# Patient Record
Sex: Female | Born: 1975 | Race: White | Hispanic: No | Smoking: Never smoker
Health system: Southern US, Community
[De-identification: ages and names within clinical notes are randomized; demographics above are authoritative.]

## PROBLEM LIST (undated history)

## (undated) DIAGNOSIS — Z9221 Personal history of antineoplastic chemotherapy: Secondary | ICD-10-CM

## (undated) DIAGNOSIS — F32A Depression, unspecified: Secondary | ICD-10-CM

## (undated) DIAGNOSIS — Z1501 Genetic susceptibility to malignant neoplasm of breast: Secondary | ICD-10-CM

## (undated) DIAGNOSIS — Z853 Personal history of malignant neoplasm of breast: Secondary | ICD-10-CM

## (undated) DIAGNOSIS — K8020 Calculus of gallbladder without cholecystitis without obstruction: Secondary | ICD-10-CM

## (undated) DIAGNOSIS — R911 Solitary pulmonary nodule: Secondary | ICD-10-CM

## (undated) DIAGNOSIS — K219 Gastro-esophageal reflux disease without esophagitis: Secondary | ICD-10-CM

## (undated) DIAGNOSIS — R1900 Intra-abdominal and pelvic swelling, mass and lump, unspecified site: Secondary | ICD-10-CM

## (undated) DIAGNOSIS — G4700 Insomnia, unspecified: Secondary | ICD-10-CM

## (undated) DIAGNOSIS — K811 Chronic cholecystitis: Secondary | ICD-10-CM

## (undated) DIAGNOSIS — Z1509 Genetic susceptibility to other malignant neoplasm: Secondary | ICD-10-CM

## (undated) DIAGNOSIS — F419 Anxiety disorder, unspecified: Secondary | ICD-10-CM

---

## 2008-10-21 DIAGNOSIS — C50919 Malignant neoplasm of unspecified site of unspecified female breast: Secondary | ICD-10-CM

## 2008-10-21 HISTORY — DX: Malignant neoplasm of unspecified site of unspecified female breast: C50.919

## 2008-12-06 HISTORY — PX: TOTAL ABDOMINAL HYSTERECTOMY: SHX209

## 2009-01-28 HISTORY — PX: MASTECTOMY: SHX3

## 2019-06-18 ENCOUNTER — Other Ambulatory Visit: Payer: Self-pay | Admitting: Internal Medicine

## 2019-06-18 DIAGNOSIS — Z13820 Encounter for screening for osteoporosis: Secondary | ICD-10-CM

## 2019-06-18 DIAGNOSIS — E8940 Asymptomatic postprocedural ovarian failure: Secondary | ICD-10-CM

## 2019-06-30 ENCOUNTER — Ambulatory Visit
Admission: RE | Admit: 2019-06-30 | Discharge: 2019-06-30 | Disposition: A | Discharge status: Home / Self Care | Payer: BC Managed Care – PPO | Source: Ambulatory Visit | Attending: Internal Medicine | Admitting: Internal Medicine

## 2019-06-30 DIAGNOSIS — E8940 Asymptomatic postprocedural ovarian failure: Secondary | ICD-10-CM | POA: Diagnosis present

## 2019-06-30 DIAGNOSIS — Z13820 Encounter for screening for osteoporosis: Secondary | ICD-10-CM | POA: Insufficient documentation

## 2019-06-30 HISTORY — DX: Personal history of antineoplastic chemotherapy: Z92.21

## 2019-07-26 ENCOUNTER — Ambulatory Visit (INDEPENDENT_AMBULATORY_CARE_PROVIDER_SITE_OTHER): Payer: BC Managed Care – PPO | Admitting: Dermatology

## 2019-07-26 ENCOUNTER — Other Ambulatory Visit: Payer: Self-pay

## 2019-07-26 DIAGNOSIS — D485 Neoplasm of uncertain behavior of skin: Secondary | ICD-10-CM

## 2019-07-26 NOTE — Patient Instructions (Addendum)

## 2019-07-26 NOTE — Progress Notes (Signed)
   New Patient Visit  Subjective  Sheila Haney is a 44 y.o. female who presents for the following: Skin Problem.  Patient here today for growth at right eye. Present for about 1 month and has started growing over the last 2 weeks. It does itch occasionally and is tender to touch if patient hits it.  Patient is a breast cancer survivor but no family or personal history of skin cancer.  The following portions of the chart were reviewed this encounter and updated as appropriate:  Allergies  Meds  Problems  Med Hx  Surg Hx  Fam Hx      Review of Systems:  No other skin or systemic complaints except as noted in HPI or Assessment and Plan.  Objective  Well appearing patient in no apparent distress; mood and affect are within normal limits.  A focused examination was performed including face. Relevant physical exam findings are noted in the Assessment and Plan.  Objective  Right Lower Eyelid Margin: 0.5cm yellow papule with history of rapid growth   Assessment & Plan    Neoplasm of uncertain behavior of skin Right Lower Eyelid Margin  Skin / nail biopsy Type of biopsy: tangential   Informed consent: discussed and consent obtained   Timeout: patient name, date of birth, surgical site, and procedure verified   Procedure prep:  Patient was prepped and draped in usual sterile fashion Prep type:  Isopropyl alcohol Anesthesia: the lesion was anesthetized in a standard fashion   Anesthetic:  1% lidocaine w/ epinephrine 1-100,000 buffered w/ 8.4% NaHCO3 Instrument used: flexible razor blade   Hemostasis achieved with: pressure, aluminum chloride and electrodesiccation   Outcome: patient tolerated procedure well   Post-procedure details: sterile dressing applied and wound care instructions given   Dressing type: petrolatum and bandage    Specimen 1 - Surgical pathology Differential Diagnosis: Cyst vs Irritated SK vs Wart vs Other r/o CA Check Margins: No 0.5cm yellow papule  with history of rapid growth  Return if symptoms worsen or fail to improve.  Graciella Belton, RMA, am acting as scribe for Sarina Ser, MD . Documentation: I have reviewed the above documentation for accuracy and completeness, and I agree with the above.  Sarina Ser, MD

## 2019-08-09 ENCOUNTER — Encounter: Payer: Self-pay | Admitting: Dermatology

## 2019-08-09 ENCOUNTER — Telehealth: Payer: Self-pay

## 2019-08-09 NOTE — Telephone Encounter (Signed)
Pt had a few more questions about her pathology results.  She was wondering if warts were contagious.  I adivsed warts are contagious.  Advised to wash hands after woundcare.

## 2019-08-09 NOTE — Telephone Encounter (Signed)
Patient advised bx benign viral wart, scheduled for follow up 09/21/19, JS

## 2019-08-09 NOTE — Telephone Encounter (Signed)
Left message for patient to call office for pathology results.

## 2019-08-09 NOTE — Telephone Encounter (Signed)
-----   Message from Ralene Bathe, MD sent at 08/08/2019  7:08 PM EDT ----- Skin , right lower eyelid margin VERRUCA VULGARIS, IRRITATED, INFLAMED, BASE INVOLVED  Benign viral wart May persist or recur If not scheduled, recommend rechecking in about 6 weeks to see if needs further treatment. Please schedule patient an appt.

## 2019-09-21 ENCOUNTER — Ambulatory Visit: Payer: BC Managed Care – PPO | Admitting: Dermatology

## 2019-10-07 ENCOUNTER — Ambulatory Visit: Payer: BC Managed Care – PPO | Admitting: Dermatology

## 2019-10-22 ENCOUNTER — Inpatient Hospital Stay: Payer: BC Managed Care – PPO | Attending: Oncology | Admitting: Oncology

## 2019-10-22 ENCOUNTER — Encounter: Payer: Self-pay | Admitting: Oncology

## 2019-10-22 ENCOUNTER — Other Ambulatory Visit: Payer: Self-pay

## 2019-10-22 ENCOUNTER — Telehealth: Payer: Self-pay | Admitting: *Deleted

## 2019-10-22 ENCOUNTER — Ambulatory Visit
Admission: RE | Admit: 2019-10-22 | Discharge: 2019-10-22 | Disposition: A | Discharge status: Home / Self Care | Payer: BC Managed Care – PPO | Attending: Oncology | Admitting: Oncology

## 2019-10-22 ENCOUNTER — Ambulatory Visit
Admission: RE | Admit: 2019-10-22 | Discharge: 2019-10-22 | Disposition: A | Discharge status: Home / Self Care | Payer: BC Managed Care – PPO | Source: Ambulatory Visit | Attending: Oncology | Admitting: Oncology

## 2019-10-22 ENCOUNTER — Inpatient Hospital Stay: Payer: BC Managed Care – PPO

## 2019-10-22 ENCOUNTER — Telehealth: Payer: Self-pay

## 2019-10-22 VITALS — BP 122/90 | HR 99 | Temp 97.7°F | Resp 18 | Ht 71.65 in | Wt 194.6 lb

## 2019-10-22 DIAGNOSIS — Z853 Personal history of malignant neoplasm of breast: Secondary | ICD-10-CM

## 2019-10-22 DIAGNOSIS — R0789 Other chest pain: Secondary | ICD-10-CM | POA: Insufficient documentation

## 2019-10-22 DIAGNOSIS — Z9013 Acquired absence of bilateral breasts and nipples: Secondary | ICD-10-CM | POA: Diagnosis not present

## 2019-10-22 DIAGNOSIS — Z79899 Other long term (current) drug therapy: Secondary | ICD-10-CM | POA: Insufficient documentation

## 2019-10-22 DIAGNOSIS — Z1501 Genetic susceptibility to malignant neoplasm of breast: Secondary | ICD-10-CM | POA: Diagnosis not present

## 2019-10-22 DIAGNOSIS — Z9221 Personal history of antineoplastic chemotherapy: Secondary | ICD-10-CM | POA: Diagnosis not present

## 2019-10-22 DIAGNOSIS — Z1509 Genetic susceptibility to other malignant neoplasm: Secondary | ICD-10-CM

## 2019-10-22 LAB — CBC WITH DIFFERENTIAL/PLATELET
Abs Immature Granulocytes: 0.02 10*3/uL (ref 0.00–0.07)
Basophils Absolute: 0.1 10*3/uL (ref 0.0–0.1)
Basophils Relative: 1 %
Eosinophils Absolute: 0.2 10*3/uL (ref 0.0–0.5)
Eosinophils Relative: 2 %
HCT: 44.1 % (ref 36.0–46.0)
Hemoglobin: 15.5 g/dL — ABNORMAL HIGH (ref 12.0–15.0)
Immature Granulocytes: 0 %
Lymphocytes Relative: 19 %
Lymphs Abs: 1.5 10*3/uL (ref 0.7–4.0)
MCH: 30.2 pg (ref 26.0–34.0)
MCHC: 35.1 g/dL (ref 30.0–36.0)
MCV: 86 fL (ref 80.0–100.0)
Monocytes Absolute: 0.4 10*3/uL (ref 0.1–1.0)
Monocytes Relative: 5 %
Neutro Abs: 5.9 10*3/uL (ref 1.7–7.7)
Neutrophils Relative %: 73 %
Platelets: 221 10*3/uL (ref 150–400)
RBC: 5.13 MIL/uL — ABNORMAL HIGH (ref 3.87–5.11)
RDW: 12.3 % (ref 11.5–15.5)
WBC: 8 10*3/uL (ref 4.0–10.5)
nRBC: 0 % (ref 0.0–0.2)

## 2019-10-22 LAB — COMPREHENSIVE METABOLIC PANEL
ALT: 23 U/L (ref 0–44)
AST: 21 U/L (ref 15–41)
Albumin: 4.6 g/dL (ref 3.5–5.0)
Alkaline Phosphatase: 49 U/L (ref 38–126)
Anion gap: 9 (ref 5–15)
BUN: 17 mg/dL (ref 6–20)
CO2: 30 mmol/L (ref 22–32)
Calcium: 9.6 mg/dL (ref 8.9–10.3)
Chloride: 101 mmol/L (ref 98–111)
Creatinine, Ser: 1.11 mg/dL — ABNORMAL HIGH (ref 0.44–1.00)
GFR calc Af Amer: >60 mL/min (ref >60)
GFR calc non Af Amer: >60 mL/min (ref >60)
Glucose, Bld: 105 mg/dL — ABNORMAL HIGH (ref 70–99)
Potassium: 4.7 mmol/L (ref 3.5–5.1)
Sodium: 140 mmol/L (ref 135–145)
Total Bilirubin: 0.9 mg/dL (ref 0.3–1.2)
Total Protein: 7.7 g/dL (ref 6.5–8.1)

## 2019-10-22 NOTE — Progress Notes (Addendum)
Hematology/Oncology Consult note [facility] [phone] [phone]   Patient Care Team: Patient, No Pcp Per as PCP - General (General Practice)  REFERRING PROVIDER: No ref. provider found  CHIEF COMPLAINTS/REASON FOR VISIT:  Evaluation of history of breast cancer and breast pain  HISTORY OF PRESENTING ILLNESS:   Sheila Haney is a  44 y.o.  female with PMH listed below was seen in consultation at the request of  No ref. provider found  for evaluation of history of breast cancer and breat pain.  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  Also reports to have right breast precancer lesions.  She was 33 at that time.  Genetic testing was done and was positive for BRCA1 mutation. Patient reports that she had bilateral mastectomy with sentinel lymph node biopsy.  She also reports history of total hysterectomy and oophorectomy Post surgery,  She received ddAC followed by Taxol, and then 5 years of tamoxifen. Her oncology care was by Dr. Fabienne Bruns in [place].  Sur[facility] done [facility].  She then moved to [place] and she obtained medical advice after experienced right chest wall discomforts.  She had a PET scan done which did not show any bone problem but per patient she had abnormal lung findings for which she got biopsy.  Biopsy was negative for malignancy.  She moved to [place] recently and was referred to establish care with oncology and also patient reports 4 to 5 weeks of left rib cage dull pain which did not go away after taking Tylenol.  No other bone pain.  Worsened with deep breath.   Review of Systems  Constitutional: Negative for appetite change, chills, fatigue and fever.  HENT:   Negative for hearing loss and voice  change.   Eyes: Negative for eye problems.  Respiratory: Negative for chest tightness and cough.   Cardiovascular: Negative for chest pain.  Gastrointestinal: Negative for abdominal distention, abdominal pain and blood in stool.  Endocrine: Negative for hot flashes.  Genitourinary: Negative for difficulty urinating and frequency.   Musculoskeletal: Negative for arthralgias.  Skin: Negative for itching and rash.  Neurological: Negative for extremity weakness.  Hematological: Negative for adenopathy.  Psychiatric/Behavioral: Negative for confusion.  See HPI  MEDICAL HISTORY:  Past Medical History:  Diagnosis Date  . Personal history of chemotherapy     SURGICAL HISTORY: Past Surgical History:  Procedure Laterality Date  . MASTECTOMY Bilateral 2011    SOCIAL HISTORY: Social History   Socioeconomic History  . Marital status: Soil scientist    Spouse name: Not on file  . Number of children: Not on file  . Years of education: Not on file  . Highest education level: Not on file  Occupational History  . Not on file  Tobacco Use  . Smoking status: Not on file  Substance and Sexual Activity  . Alcohol use: Not on file  . Drug use: Not on file  . Sexual activity: Not on file  Other Topics Concern  . Not on file  Social History Narrative  . Not on file   Social Determinants of Health   Financial Resource Strain:   . Difficulty of Paying Living Expenses: Not on file  Food Insecurity:   . Worried About Charity fundraiser in the  Last Year: Not on file  . Ran Out of Food in the Last Year: Not on file  Transportation Needs:   . Lack of Transportation (Medical): Not on file  . Lack of Transportation (Non-Medical): Not on file  Physical Activity:   . Days of Exercise per Week: Not on file  . Minutes of Exercise per Session: Not on file  Stress:   . Feeling of Stress : Not on file  Social Connections:   . Frequency of Communication with Friends and Family: Not on file    . Frequency of Social Gatherings with Friends and Family: Not on file  . Attends Religious Services: Not on file  . Active Member of Clubs or Organizations: Not on file  . Attends Archivist Meetings: Not on file  . Marital Status: Not on file  Intimate Partner Violence:   . Fear of Current or Ex-Partner: Not on file  . Emotionally Abused: Not on file  . Physically Abused: Not on file  . Sexually Abused: Not on file    FAMILY HISTORY: No family history on file.  ALLERGIES:  has No Known Allergies.  MEDICATIONS:  Current Outpatient Medications  Medication Sig Dispense Refill  . citalopram (CELEXA) 20 MG tablet Take by mouth.    . fluticasone (FLONASE) 50 MCG/ACT nasal spray 1 spray by Both Nostrils route daily.    Marland Kitchen loratadine (CLARITIN) 10 MG tablet Take by mouth.    Marland Kitchen LORazepam (ATIVAN) 1 MG tablet Take by mouth.    . Multiple Vitamins-Minerals (MULTIVITAMIN WITH MINERALS) tablet Take 1 tablet by mouth daily.    . pantoprazole (PROTONIX) 40 MG tablet Take by mouth.    . traZODone (DESYREL) 50 MG tablet Take 1.5-2 tablets at bedtime as needed     No current facility-administered medications for this visit.     PHYSICAL EXAMINATION: ECOG PERFORMANCE STATUS: 0 - Asymptomatic Vitals:   10/22/19 0937  BP: 122/90  Pulse: 99  Resp: 18  Temp: 97.7 F (36.5 C)   Filed Weights   10/22/19 0937  Weight: 194 lb 9.6 oz (88.3 kg)    Physical Exam Constitutional:      General: She is not in acute distress. HENT:     Head: Normocephalic and atraumatic.  Eyes:     General: No scleral icterus. Cardiovascular:     Rate and Rhythm: Normal rate and regular rhythm.     Heart sounds: Normal heart sounds.  Pulmonary:     Effort: Pulmonary effort is normal. No respiratory distress.     Breath sounds: No wheezing.  Abdominal:     General: Bowel sounds are normal. There is no distension.     Palpations: Abdomen is soft.  Musculoskeletal:        General: No deformity.  Normal range of motion.     Cervical back: Normal range of motion and neck supple.     Comments: Tenderness of anterior chest wall/rib cage  Skin:    General: Skin is warm and dry.     Findings: No erythema or rash.  Neurological:     Mental Status: She is alert and oriented to person, place, and time. Mental status is at baseline.     Cranial Nerves: No cranial nerve deficit.     Coordination: Coordination normal.  Psychiatric:        Mood and Affect: Mood normal.    Patient has history of bilateral mastectomy,  LABORATORY DATA:  I have reviewed the data as listed  No results found for: WBC, HGB, HCT, MCV, PLT No results for input(s): NA, K, CL, CO2, GLUCOSE, BUN, CREATININE, CALCIUM, GFRNONAA, GFRAA, PROT, ALBUMIN, AST, ALT, ALKPHOS, BILITOT, BILIDIR, IBILI in the last 8760 hours. Iron/TIBC/Ferritin/ %Sat No results found for: IRON, TIBC, FERRITIN, IRONPCTSAT    RADIOGRAPHIC STUDIES: I have personally reviewed the radiological images as listed and agreed with the findings in the report. No results found.    ASSESSMENT & PLAN:  1. Personal history of malignant neoplasm of breast   2. Chest wall pain   3. BRCA1 positive    #Self-reported history of left breast ER positive HER-2 negative cancer status post mastectomy and sentinel lymph node biopsy followed by adjuvant ddAC-T chemotherapy and 5 years of tamoxifen. Chest wall pain,Unknown etiology Likely musculoskeletal. Recommend to obtain chest x-ray/rib cage for further evaluation. Recommend patient to use ibuprofen 600 mg 3 times daily for 1 week and see if there is any improvement. Meanwhile I will try to obtain medical records from previous oncology clinic-Per patient it has been permanently closed, in the hospital where she had breast surgery done. Check CBC and CMP. BRCA positivity, patient does not have any records of genetic testing. -X-ray did not show any visible displaced rib fracture or suspicious lesions in the  left chest wall.  Orders Placed This Encounter  Procedures  . DG Ribs Unilateral W/Chest Left    Standing Status:   Future    Number of Occurrences:   1    Standing Expiration Date:   10/21/2020    Order Specific Question:   Reason for Exam (SYMPTOM  OR DIAGNOSIS REQUIRED)    Answer:   chest wall pain    Order Specific Question:   Is patient pregnant?    Answer:   No    Order Specific Question:   Preferred imaging location?    Answer:   [facility]    Order Specific Question:   Radiology Contrast Protocol - do NOT remove file path    Answer:   [email or web address]  . Comprehensive metabolic panel    Standing Status:   Future    Number of Occurrences:   1    Standing Expiration Date:   10/21/2020  . CBC with Differential/Platelet    Standing Status:   Future    Number of Occurrences:   1    Standing Expiration Date:   10/21/2020    All questions were answered. The patient knows to call the clinic with any problems questions or concerns.  Return of visit: To be determined.  Awaiting for medical records Thank you for this kind referral and the opportunity to participate in the care of this patient. A copy of today's note is routed to referring provider    Earlie Server, MD, PhD Hematology Oncology [facility] Pager- [phone] 10/22/2019  Addendum, patient has tried NSAIDs for 2 weeks and continues to have left-sided chest wall pain. Given her reported history of breast cancer, BRCA1 positivity, I will obtain PET scan for further evaluation.  Rule out metastasis.  Earlie Server 11/05/2019

## 2019-10-22 NOTE — Telephone Encounter (Signed)
Patient called and reports that her PET scan was done at [place] phone [phone]

## 2019-10-22 NOTE — Progress Notes (Signed)
Patient has recently moved to [facility] and has history of breast cancer.  She is having left chest wall pain located at original incision site that is 4-5/10 on pain scale.

## 2019-10-22 NOTE — Telephone Encounter (Signed)
Dr. Bryna Colander like to review old records (surgical pathology and oncology notes) for this pt from when she was living in [place]. I have contacted 3 different offices and they are unable to find records for this pt. Pt also reported that they may be under the last name "Doyle Askew." I called [facility], [place] and [facility] and med rec was still not able to find patient records. Pt does not have any records with her.  MD notified and would like for records of PET in [place] to be obtained.   Per pt, she had PET done at [facility]. Ph# [phone]. Will call on Monday to get fax number and fax release of info request.

## 2019-10-25 NOTE — Telephone Encounter (Signed)
Patient attached PET scan and CT reports from [facility], via [employer]. Reports available to view in chart under media tab.

## 2019-11-05 NOTE — Telephone Encounter (Signed)
Please schedule PET scan and see MD 2 days later.  Call patient with appt details.

## 2019-11-11 ENCOUNTER — Ambulatory Visit
Admission: RE | Admit: 2019-11-11 | Discharge: 2019-11-11 | Disposition: A | Discharge status: Home / Self Care | Payer: BC Managed Care – PPO | Source: Ambulatory Visit | Attending: Oncology | Admitting: Oncology

## 2019-11-11 ENCOUNTER — Other Ambulatory Visit: Payer: Self-pay

## 2019-11-11 DIAGNOSIS — R911 Solitary pulmonary nodule: Secondary | ICD-10-CM | POA: Diagnosis not present

## 2019-11-11 DIAGNOSIS — I700 Atherosclerosis of aorta: Secondary | ICD-10-CM | POA: Insufficient documentation

## 2019-11-11 DIAGNOSIS — R0789 Other chest pain: Secondary | ICD-10-CM | POA: Diagnosis present

## 2019-11-11 DIAGNOSIS — Z853 Personal history of malignant neoplasm of breast: Secondary | ICD-10-CM | POA: Diagnosis not present

## 2019-11-11 LAB — GLUCOSE, CAPILLARY: Glucose-Capillary: 91 mg/dL (ref 70–99)

## 2019-11-11 MED ORDER — FLUDEOXYGLUCOSE F - 18 (FDG) INJECTION
10.1000 | Freq: Once | INTRAVENOUS | Status: AC | PRN
  Administered 2019-11-11: 10.43 via INTRAVENOUS

## 2019-11-15 ENCOUNTER — Inpatient Hospital Stay: Payer: BC Managed Care – PPO

## 2019-11-15 ENCOUNTER — Inpatient Hospital Stay: Payer: BC Managed Care – PPO | Attending: Oncology | Admitting: Oncology

## 2019-11-15 ENCOUNTER — Other Ambulatory Visit: Payer: Self-pay

## 2019-11-15 ENCOUNTER — Encounter: Payer: Self-pay | Admitting: Oncology

## 2019-11-15 VITALS — BP 118/83 | HR 88 | Temp 97.5°F | Resp 18 | Wt 196.6 lb

## 2019-11-15 DIAGNOSIS — N644 Mastodynia: Secondary | ICD-10-CM | POA: Insufficient documentation

## 2019-11-15 DIAGNOSIS — R079 Chest pain, unspecified: Secondary | ICD-10-CM | POA: Diagnosis not present

## 2019-11-15 DIAGNOSIS — R7989 Other specified abnormal findings of blood chemistry: Secondary | ICD-10-CM

## 2019-11-15 DIAGNOSIS — Z853 Personal history of malignant neoplasm of breast: Secondary | ICD-10-CM | POA: Diagnosis not present

## 2019-11-15 DIAGNOSIS — Z1501 Genetic susceptibility to malignant neoplasm of breast: Secondary | ICD-10-CM | POA: Insufficient documentation

## 2019-11-15 DIAGNOSIS — I2510 Atherosclerotic heart disease of native coronary artery without angina pectoris: Secondary | ICD-10-CM | POA: Insufficient documentation

## 2019-11-15 DIAGNOSIS — Z7289 Other problems related to lifestyle: Secondary | ICD-10-CM | POA: Diagnosis not present

## 2019-11-15 DIAGNOSIS — R0789 Other chest pain: Secondary | ICD-10-CM | POA: Diagnosis not present

## 2019-11-15 DIAGNOSIS — Z9013 Acquired absence of bilateral breasts and nipples: Secondary | ICD-10-CM | POA: Diagnosis not present

## 2019-11-15 DIAGNOSIS — I700 Atherosclerosis of aorta: Secondary | ICD-10-CM | POA: Insufficient documentation

## 2019-11-15 DIAGNOSIS — Z79899 Other long term (current) drug therapy: Secondary | ICD-10-CM | POA: Insufficient documentation

## 2019-11-15 DIAGNOSIS — D751 Secondary polycythemia: Secondary | ICD-10-CM | POA: Diagnosis not present

## 2019-11-15 LAB — CBC WITH DIFFERENTIAL/PLATELET
Abs Immature Granulocytes: 0.01 10*3/uL (ref 0.00–0.07)
Basophils Absolute: 0.1 10*3/uL (ref 0.0–0.1)
Basophils Relative: 1 %
Eosinophils Absolute: 0.2 10*3/uL (ref 0.0–0.5)
Eosinophils Relative: 4 %
HCT: 43 % (ref 36.0–46.0)
Hemoglobin: 14.8 g/dL (ref 12.0–15.0)
Immature Granulocytes: 0 %
Lymphocytes Relative: 32 %
Lymphs Abs: 1.9 10*3/uL (ref 0.7–4.0)
MCH: 29.7 pg (ref 26.0–34.0)
MCHC: 34.4 g/dL (ref 30.0–36.0)
MCV: 86.3 fL (ref 80.0–100.0)
Monocytes Absolute: 0.4 10*3/uL (ref 0.1–1.0)
Monocytes Relative: 6 %
Neutro Abs: 3.2 10*3/uL (ref 1.7–7.7)
Neutrophils Relative %: 57 %
Platelets: 234 10*3/uL (ref 150–400)
RBC: 4.98 MIL/uL (ref 3.87–5.11)
RDW: 12.3 % (ref 11.5–15.5)
WBC: 5.8 10*3/uL (ref 4.0–10.5)
nRBC: 0 % (ref 0.0–0.2)

## 2019-11-15 LAB — BASIC METABOLIC PANEL
Anion gap: 6 (ref 5–15)
BUN: 14 mg/dL (ref 6–20)
CO2: 28 mmol/L (ref 22–32)
Calcium: 8.9 mg/dL (ref 8.9–10.3)
Chloride: 104 mmol/L (ref 98–111)
Creatinine, Ser: 1.05 mg/dL — ABNORMAL HIGH (ref 0.44–1.00)
GFR, Estimated: >60 mL/min (ref >60)
Glucose, Bld: 99 mg/dL (ref 70–99)
Potassium: 4.2 mmol/L (ref 3.5–5.1)
Sodium: 138 mmol/L (ref 135–145)

## 2019-11-15 NOTE — Progress Notes (Signed)
Pt here for follow up. No new concerns & no new breast problems.

## 2019-11-15 NOTE — Progress Notes (Signed)
Hematology/Oncology Consult note [facility] Telephone:([phone] Fax:(336) [phone]   Patient Care Team: Wille Glaser, MD as PCP - General (Internal Medicine)  REFERRING PROVIDER: No ref. provider found  CHIEF COMPLAINTS/REASON FOR VISIT:  Evaluation of history of breast cancer and breast pain  HISTORY OF PRESENTING ILLNESS:   Sheila Haney is a  44 y.o.  female with PMH listed below was seen in consultation at the request of  No ref. provider found  for evaluation of history of breast cancer and breat pain.  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  Also reports to have right breast precancer lesions.  She was 33 at that time.  Genetic testing was done and was positive for BRCA1 mutation. Patient reports that she had bilateral mastectomy with sentinel lymph node biopsy.  She also reports history of total hysterectomy and oophorectomy Post surgery,  She received ddAC followed by Taxol, and then 5 years of tamoxifen. Her oncology care was by Dr. Fabienne Bruns in [place].  Surgery was done at [facility].  She then moved to [place] and she obtained medical advice after experienced right chest wall discomforts.  She had a PET scan done which did not show any bone problem but per patient she had abnormal lung findings for which she got biopsy.  Biopsy was negative for malignancy.  She moved to [place] recently and was referred to establish care with oncology and also patient reports 4 to 5 weeks of left rib cage dull pain which did not go away after taking Tylenol.  No other bone pain.  Worsened with deep breath.  # BRCA 1 positivity, I was not able to obtain any records from oncologist as the clinic has permanently closed.   INTERVAL  HISTORY Sheila Haney is a 44 y.o. female who has above history reviewed by me today presents for follow up visit for management of history of breast cancer, BRCA1 mutation, chest pain Problems and complaints are listed below:  During the interval, patient has reported that the chest pain has not improved after 2 weeks of ibuprofen.  She then underwent PET scan for further evaluation.  She presents today to discuss about management plan.  Review of Systems  Constitutional: Negative for appetite change, chills, fatigue and fever.  HENT:   Negative for hearing loss and voice change.   Eyes: Negative for eye problems.  Respiratory: Negative for chest tightness and cough.   Cardiovascular: Negative for chest pain.  Gastrointestinal: Negative for abdominal distention, abdominal pain and blood in stool.  Endocrine: Negative for hot flashes.  Genitourinary: Negative for difficulty urinating and frequency.   Musculoskeletal: Negative for arthralgias.  Skin: Negative for itching and rash.  Neurological: Negative for extremity weakness.  Hematological: Negative for adenopathy.  Psychiatric/Behavioral: Negative for confusion.  See HPI  MEDICAL HISTORY:  Past Medical History:  Diagnosis Date  . Breast cancer ([place]) 10/21/2008  . Personal history of chemotherapy     SURGICAL HISTORY: Past Surgical History:  Procedure Laterality Date  . MASTECTOMY Bilateral 2011  . TOTAL ABDOMINAL HYSTERECTOMY  12/06/2008    SOCIAL HISTORY: Social History   Socioeconomic History  . Marital status: Soil scientist    Spouse name: Not on file  . Number of children: Not on file  . Years  of education: Not on file  . Highest education level: Not on file  Occupational History  . Not on file  Tobacco Use  . Smoking status: Never Smoker  Vaping Use  . Vaping Use: Never used  Substance and Sexual Activity  . Alcohol use: Yes    Alcohol/week: 6.0 standard drinks    Types: 6 Glasses of wine per week   . Drug use: Never  . Sexual activity: Not on file  Other Topics Concern  . Not on file  Social History Narrative  . Not on file   Social Determinants of Health   Financial Resource Strain:   . Difficulty of Paying Living Expenses: Not on file  Food Insecurity:   . Worried About Charity fundraiser in the Last Year: Not on file  . Ran Out of Food in the Last Year: Not on file  Transportation Needs:   . Lack of Transportation (Medical): Not on file  . Lack of Transportation (Non-Medical): Not on file  Physical Activity:   . Days of Exercise per Week: Not on file  . Minutes of Exercise per Session: Not on file  Stress:   . Feeling of Stress : Not on file  Social Connections:   . Frequency of Communication with Friends and Family: Not on file  . Frequency of Social Gatherings with Friends and Family: Not on file  . Attends Religious Services: Not on file  . Active Member of Clubs or Organizations: Not on file  . Attends Archivist Meetings: Not on file  . Marital Status: Not on file  Intimate Partner Violence:   . Fear of Current or Ex-Partner: Not on file  . Emotionally Abused: Not on file  . Physically Abused: Not on file  . Sexually Abused: Not on file    FAMILY HISTORY: Family History  Family history unknown: Yes    ALLERGIES:  has No Known Allergies.  MEDICATIONS:  Current Outpatient Medications  Medication Sig Dispense Refill  . citalopram (CELEXA) 20 MG tablet Take 20 mg by mouth daily.     . fluticasone (FLONASE) 50 MCG/ACT nasal spray 1 spray by Both Nostrils route daily.    Marland Kitchen loratadine (CLARITIN) 10 MG tablet Take 10 mg by mouth daily.     Marland Kitchen LORazepam (ATIVAN) 1 MG tablet Take by mouth every 6 (six) hours as needed.     . Multiple Vitamins-Minerals (MULTIVITAMIN WITH MINERALS) tablet Take 1 tablet by mouth daily.    . pantoprazole (PROTONIX) 40 MG tablet Take 40 mg by mouth daily.     . traZODone (DESYREL) 50 MG tablet Take 1.5-2 tablets at  bedtime as needed     No current facility-administered medications for this visit.     PHYSICAL EXAMINATION: ECOG PERFORMANCE STATUS: 0 - Asymptomatic Vitals:   11/15/19 0906  BP: 118/83  Pulse: 88  Resp: 18  Temp: (!) 97.5 F (36.4 C)   Filed Weights   11/15/19 0906  Weight: 196 lb 9.6 oz (89.2 kg)    Physical Exam Constitutional:      General: She is not in acute distress. HENT:     Head: Normocephalic and atraumatic.  Eyes:     General: No scleral icterus. Cardiovascular:     Rate and Rhythm: Normal rate and regular rhythm.     Heart sounds: Normal heart sounds.  Pulmonary:     Effort: Pulmonary effort is normal. No respiratory distress.     Breath sounds: No wheezing.  Abdominal:  General: Bowel sounds are normal. There is no distension.     Palpations: Abdomen is soft.  Musculoskeletal:        General: No deformity. Normal range of motion.     Cervical back: Normal range of motion and neck supple.     Comments: Tenderness of anterior chest wall/rib cage  Skin:    General: Skin is warm and dry.     Findings: No erythema or rash.  Neurological:     Mental Status: She is alert and oriented to person, place, and time. Mental status is at baseline.     Cranial Nerves: No cranial nerve deficit.     Coordination: Coordination normal.  Psychiatric:        Mood and Affect: Mood normal.    Patient has history of bilateral mastectomy,  LABORATORY DATA:  I have reviewed the data as listed Lab Results  Component Value Date   WBC 5.8 11/15/2019   HGB 14.8 11/15/2019   HCT 43.0 11/15/2019   MCV 86.3 11/15/2019   PLT 234 11/15/2019   Recent Labs    10/22/19 1021 11/15/19 1008  NA 140 138  K 4.7 4.2  CL 101 104  CO2 30 28  GLUCOSE 105* 99  BUN 17 14  CREATININE 1.11* 1.05*  CALCIUM 9.6 8.9  GFRNONAA >60 >60  GFRAA >60  --   PROT 7.7  --   ALBUMIN 4.6  --   AST 21  --   ALT 23  --   ALKPHOS 49  --   BILITOT 0.9  --    Iron/TIBC/Ferritin/  %Sat No results found for: IRON, TIBC, FERRITIN, IRONPCTSAT    RADIOGRAPHIC STUDIES: I have personally reviewed the radiological images as listed and agreed with the findings in the report. DG Ribs Unilateral W/Chest Left  Result Date: 10/22/2019 CLINICAL DATA:  Upper anterior chest wall pain for 5-6 weeks, history of breast cancer with bilateral mastectomy EXAM: LEFT RIBS AND CHEST - 3+ VIEW COMPARISON:  None. FINDINGS: Postsurgical changes from bilateral mastectomy with surgical clips along the bilateral chest wall/axillary regions. No visible displaced rib fracture or suspicious lesions are identified of the left chest wall. No consolidation, features of edema, pneumothorax, or effusion. The cardiomediastinal contours are unremarkable. No acute osseous or soft tissue abnormality. IMPRESSION: 1. No visible displaced rib fracture or suspicious lesions of the left chest wall. 2. Postsurgical changes from bilateral mastectomy. 3. No acute cardiopulmonary abnormality. Electronically Signed   By: Lovena Le M.D.   On: 10/22/2019 20:44   NM PET Image Initial (PI) Skull Base To Thigh  Result Date: 11/11/2019 CLINICAL DATA:  Subsequent treatment strategy for left breast cancer. Current chest wall pain. EXAM: NUCLEAR MEDICINE PET SKULL BASE TO THIGH TECHNIQUE: 10.4 mCi F-18 FDG was injected intravenously. Full-ring PET imaging was performed from the skull base to thigh after the radiotracer. CT data was obtained and used for attenuation correction and anatomic localization. Fasting blood glucose: 91 mg/dl COMPARISON:  Rib radiographs from 10/22/2019 FINDINGS: Mediastinal blood pool activity: SUV max 2.6 Liver activity: SUV max NA NECK: Likely physiologic activity in the palatine tonsils, maximum SUV 6.4 on the left and 5.9 on the right. No hypermetabolic adenopathy. A left level II lymph node on image 40 of series 3 has maximum SUV of 2.1, less than blood pool. Incidental CT findings: none CHEST: Bilateral  mastectomy with bilateral axillary dissection. No abnormal hypermetabolic activity is identified to suggest active thoracic malignancy. Incidental CT findings: Mild biapical  pleuroparenchymal scarring. Suspected 3 mm right middle lobe pulmonary nodule on image 113 of series 3. A specific cause for the patient's left upper anterior chest pain is not identified. ABDOMEN/PELVIS: No significant abnormal hypermetabolic activity in this region. Incidental CT findings: Advanced for age abdominal aortic atherosclerotic calcification. Uterus absent. Adnexa unremarkable. SKELETON: No significant abnormal hypermetabolic activity in this region. Incidental CT findings: none IMPRESSION: 1. No abnormal hypermetabolic activity to suggest recurrent malignancy. 2. A cause for the patient's left upper anterior chest pain is not identified. 3. 3 mm right middle lobe pulmonary nodule. Fleischner follow up guidelines do not apply due to the patient's history of malignancy. Statistically this small nodule is highly likely to be benign; if clinically warranted, surveillance chest CT in 1 years time might be considered. 4. Advanced for age abdominal aortic atherosclerotic calcification. Electronically Signed   By: Van Clines M.D.   On: 11/11/2019 15:33      ASSESSMENT & PLAN:  1. Chest pain, unspecified type   2. Atherosclerosis of coronary artery, unspecified vessel or lesion type, unspecified whether angina present, unspecified whether native or transplanted heart   3. Erythrocytosis   4. Elevated serum creatinine    #Self-reported history of left breast ER positive HER-2 negative cancer status post mastectomy and sentinel lymph node biopsy followed by adjuvant ddAC-T chemotherapy and 5 years of tamoxifen. PET scan was independent reviewed by me and discussed with the patient. No etiology to explain her chest pain. No evidence of breast cancer metastasis which is reassuring. BRCA 1 positivity, I was not able to  obtain any records from oncologist as the clinic has permanently closed. Discussed with the patient about genetic counselor for retesting and she declines.  #Small lung nodules, which were also present in her previous PET scan.  I will repeat CT chest in 1 year. Chest pain, unknown etiology. With the history of exposure to doxorubicin, I recommend patient to establish care with cardiologist Dr. Rockey Situ for further evaluation. On the PET scan she also seems to have aorta atherosclerosis which is advanced for age.  History of bilateral mastectomy and per patient bilateral oophorectomy and hysterectomy. Bone density was checked in June 2021 which was normal. Recommend patient to take calcium and vitamin D supplementation.  Erythrocytosis, unknown etiology Repeat CBC today.  CBC showed normalized hemoglobin to 14.8, continue monitor.  Elevated creatinine, advised oral hydration.  Avoid nephrotoxins. Patient's PCP is currently Baldo Ash and she prefers to establish care locally.  Will provide her information for PCP and she can call to establish care.  Follow-up creatinine kidney function with PCP.   Orders Placed This Encounter  Procedures  . CBC with Differential/Platelet    Standing Status:   Future    Number of Occurrences:   1    Standing Expiration Date:   11/14/2020  . Basic metabolic panel    Standing Status:   Future    Number of Occurrences:   1    Standing Expiration Date:   11/14/2020  . Ambulatory referral to Cardiology    Referral Priority:   Routine    Referral Type:   Consultation    Referral Reason:   Specialty Services Required    Referred to Provider:   Minna Merritts, MD    Requested Specialty:   Cardiology    Number of Visits Requested:   1    All questions were answered. The patient knows to call the clinic with any problems questions or concerns.  Return of  visit:  Follow-up in 1 year    Earlie Server, MD, PhD Hematology Oncology [facility] at  [facility] Pager- [phone] 11/15/2019

## 2019-11-24 ENCOUNTER — Encounter: Payer: Self-pay | Admitting: Cardiovascular Disease

## 2019-11-24 ENCOUNTER — Other Ambulatory Visit: Payer: Self-pay

## 2019-11-24 ENCOUNTER — Ambulatory Visit (INDEPENDENT_AMBULATORY_CARE_PROVIDER_SITE_OTHER): Payer: BC Managed Care – PPO | Admitting: Cardiovascular Disease

## 2019-11-24 VITALS — BP 110/80 | HR 92 | Ht 71.65 in | Wt 196.0 lb

## 2019-11-24 DIAGNOSIS — R079 Chest pain, unspecified: Secondary | ICD-10-CM

## 2019-11-24 DIAGNOSIS — Z853 Personal history of malignant neoplasm of breast: Secondary | ICD-10-CM | POA: Diagnosis not present

## 2019-11-24 DIAGNOSIS — I700 Atherosclerosis of aorta: Secondary | ICD-10-CM

## 2019-11-24 DIAGNOSIS — R0602 Shortness of breath: Secondary | ICD-10-CM | POA: Diagnosis not present

## 2019-11-24 NOTE — Patient Instructions (Addendum)
Medication Instructions:  No changes  If you need a refill on your cardiac medications before your next appointment, please call your pharmacy.    Lab work: No new labs needed   If you have labs (blood work) drawn today and your tests are completely normal, you will receive your results only by:  [place] (if you have MyChart) OR  A paper copy in the mail If you have any lab test that is abnormal or we need to change your treatment, we will call you to review the results.   Testing/Procedures: - Your physician has requested that you have an echocardiogram. Echocardiography is a painless test that uses sound waves to create images of your heart. It provides your doctor with information about the size and shape of your heart and how well your hearts chambers and valves are working. This procedure takes approximately one hour. There are no restrictions for this procedure. There is a possibility that an IV may need to be started during your test to inject an image enhancing agent. This is done to obtain more optimal pictures of your heart. Therefore we ask that you do at least drink some water prior to coming in to hydrate your veins.    Follow-Up: At [facility], you and your health needs are our priority.  As part of our continuing mission to provide you with exceptional heart care, we have created designated Provider Care Teams.  These Care Teams include your primary Cardiologist (physician) and Advanced Practice Providers (APPs -  Physician Assistants and Nurse Practitioners) who all work together to provide you with the care you need, when you need it.   You will need a follow up appointment as needed    Providers on your designated Care Team:    Murray Hodgkins, NP  Christell Faith, PA-C  Marrianne Mood, PA-C  Any Other Special Instructions Will Be Listed Below (If Applicable).  COVID-19 Vaccine Information can be found at:  ShippingScam.co.uk For questions related to vaccine distribution or appointments, please email vaccine@[email or web address] .com or call 2240112353.     Echocardiogram An echocardiogram is a procedure that uses painless sound waves (ultrasound) to produce an image of the heart. Images from an echocardiogram can provide important information about:  Signs of coronary artery disease (CAD).  Aneurysm detection. An aneurysm is a weak or damaged part of an artery wall that bulges out from the normal force of blood pumping through the body.  Heart size and shape. Changes in the size or shape of the heart can be associated with certain conditions, including heart failure, aneurysm, and CAD.  Heart muscle function.  Heart valve function.  Signs of a past heart attack.  Fluid buildup around the heart.  Thickening of the heart muscle.  A tumor or infectious growth around the heart valves. Tell a health care provider about:  Any allergies you have.  All medicines you are taking, including vitamins, herbs, eye drops, creams, and over-the-counter medicines.  Any blood disorders you have.  Any surgeries you have had.  Any medical conditions you have.  Whether you are pregnant or may be pregnant. What are the risks? Generally, this is a safe procedure. However, problems may occur, including:  Allergic reaction to dye (contrast) that may be used during the procedure. What happens before the procedure? No specific preparation is needed. You may eat and drink normally. What happens during the procedure?   An IV tube may be inserted into one of your veins.  You may  receive contrast through this tube. A contrast is an injection that improves the quality of the pictures from your heart.  A gel will be applied to your chest.  A wand-like tool (transducer) will be moved over your chest. The gel will help to transmit the sound waves from  the transducer.  The sound waves will harmlessly bounce off of your heart to allow the heart images to be captured in real-time motion. The images will be recorded on a computer. The procedure may vary among health care providers and hospitals. What happens after the procedure?  You may return to your normal, everyday life, including diet, activities, and medicines, unless your health care provider tells you not to do that. Summary  An echocardiogram is a procedure that uses painless sound waves (ultrasound) to produce an image of the heart.  Images from an echocardiogram can provide important information about the size and shape of your heart, heart muscle function, heart valve function, and fluid buildup around your heart.  You do not need to do anything to prepare before this procedure. You may eat and drink normally.  After the echocardiogram is completed, you may return to your normal, everyday life, unless your health care provider tells you not to do that. This information is not intended to replace advice given to you by your health care provider. Make sure you discuss any questions you have with your health care provider. Document Revised: 05/07/2018 Document Reviewed: 02/17/2016 Elsevier Patient Education  [place].

## 2019-11-24 NOTE — Progress Notes (Signed)
Cardiology Office Note  Date:  11/24/2019   ID:  Sheila Haney, DOB 11/25/1975, MRN [id number]  PCP:  Wille Glaser, MD   Chief Complaint  Patient presents with  . New Patient (Initial Visit)    Referred by Dr. Tasia Catchings for CP and Coronary atherosclerosis  Pt c/o chest pain--when resting pain is sharp, when active pain is more throbbing/squeezing; no other Sx.     HPI:  Ms. Sheila Haney is a 44 year old woman with past medical history of History of breast cancer, breast pain diagnosed 2010  ER positive, HER-2 negative left breast cancer, tumor was 4- 5 cm positive for BRCA1 mutation received ddAC followed by Taxol, and then 5 years of tamoxifen. total hysterectomy and oophorectomy Referred by Dr. Tasia Catchings for consultation of her chest pain, history of exposure to Adriamycin Prior PET scan with aortic atherosclerosis  Discussed prior history of breast cancer, diagnosis 10 years ago Received chemotherapy, no radiation  Notes from oncology reviewed, Prior history of right chest wall discomforts.   PET scan done which did not show any bone problem but per patient she had abnormal lung findings for which she got biopsy.  Biopsy was negative for malignancy.   4 to 5 weeks of left rib cage dull pain which did not go away after taking Tylenol.  No other bone pain.  Worsened with deep breath.  PET scan November 11, 2019 Images pulled up and reviewed Minimal distal aortic atherosclerosis noted  Not feeling well 8 weeks Less energy, leg pain Used to be able to workout for 30 minutes, more recently has not been able to workout  Having problems with constipation,  Reports having 2 types of chest pain,  1 is a stabby chest pain, at rest, feels more musculoskeletal or nerve  Second type of chest pain is more of a gripping, throbby pain, feels deeper Happens at rest or with exertion  Lab work reviewed Total chol 227,  ldl 142, tg 30   Mom: enlarged heart,  father details  unclear  EKG personally reviewed by myself on todays visit Shows normal sinus rhythm rate 92 bpm no significant ST or T wave changes  PMH:   has a past medical history of Breast cancer ([place]) (10/21/2008) and Personal history of chemotherapy.  PSH:    Past Surgical History:  Procedure Laterality Date  . MASTECTOMY Bilateral 2011  . TOTAL ABDOMINAL HYSTERECTOMY  12/06/2008    Current Outpatient Medications  Medication Sig Dispense Refill  . citalopram (CELEXA) 20 MG tablet Take 20 mg by mouth daily.     . fluticasone (FLONASE) 50 MCG/ACT nasal spray 1 spray by Both Nostrils route daily.    Marland Kitchen loratadine (CLARITIN) 10 MG tablet Take 10 mg by mouth daily.     Marland Kitchen LORazepam (ATIVAN) 1 MG tablet Take by mouth every 6 (six) hours as needed.     . Multiple Vitamins-Minerals (MULTIVITAMIN WITH MINERALS) tablet Take 1 tablet by mouth daily.    . pantoprazole (PROTONIX) 40 MG tablet Take 40 mg by mouth daily.     . traZODone (DESYREL) 50 MG tablet Take 1.5-2 tablets at bedtime as needed     No current facility-administered medications for this visit.     Allergies:   Patient has no known allergies.   Social History:  The patient  reports that she has never smoked. She has never used smokeless tobacco. She reports current alcohol use of about 6.0 standard drinks of alcohol per week. She reports that she  does not use drugs.   Family History:   Family history is unknown by patient.    Review of Systems: Review of Systems  Constitutional: Negative.   HENT: Negative.   Respiratory: Negative.   Cardiovascular: Positive for chest pain.  Gastrointestinal: Negative.   Musculoskeletal: Negative.   Neurological: Negative.   Psychiatric/Behavioral: Negative.   All other systems reviewed and are negative.    PHYSICAL EXAM: VS:  BP 110/80   Pulse 92   Ht 5' 11.65" (1.82 m)   Wt 196 lb (88.9 kg)   BMI 26.84 kg/m  , BMI Body mass index is 26.84 kg/m. Constitutional:  oriented to person,  place, and time. No distress.  HENT:  Head: Grossly normal Eyes:  no discharge. No scleral icterus.  Neck: No JVD, no carotid bruits  Cardiovascular: Regular rate and rhythm, no murmurs appreciated Pulmonary/Chest: Clear to auscultation bilaterally, no wheezes or rails Abdominal: Soft.  no distension.  no tenderness.  Musculoskeletal: Normal range of motion Neurological:  normal muscle tone. Coordination normal. No atrophy Skin: Skin warm and dry Psychiatric: normal affect, pleasant   Recent Labs: 10/22/2019: ALT 23 11/15/2019: BUN 14; Creatinine, Ser 1.05; Hemoglobin 14.8; Platelets 234; Potassium 4.2; Sodium 138    Lipid Panel No results found for: CHOL, HDL, LDLCALC, TRIG    Wt Readings from Last 3 Encounters:  11/24/19 196 lb (88.9 kg)  11/15/19 196 lb 9.6 oz (89.2 kg)  10/22/19 194 lb 9.6 oz (88.3 kg)      ASSESSMENT AND PLAN:  Problem List Items Addressed This Visit    History of breast cancer    Other Visit Diagnoses    Aortic atherosclerosis ([place])    -  Primary   Chest pain of uncertain etiology         Chest pain Atypical in nature Non-smoker, no diabetes Risk factor including hyperlipidemia No significant coronary calcification on PET CT,  Discussed various types of stress testing including treadmill stress echo, Myoview even cardiac CTA -Recommended to start we order echocardiogram  Shortness of breath Giving out on exercise past 8 weeks, etiology unclear Also with significant leg pain which is nonspecific She is establishing with new primary care physician Prior chemotherapy Echocardiogram as above  Hyperlipidemia Mild distal aorta atherosclerosis noted on CT images No significant coronary calcification We will hold off on adding a statin at this time given her leg discomfort on exercise Statin or Zetia can be added at a later date    Total encounter time more than 60 minutes  Greater than 50% was spent in counseling and coordination of care  with the patient  Patient was referred by Dr. Tasia Catchings for consultation of her chest pain or shortness of breath, will be referred back to her office for ongoing care of the issues detailed above    Signed, Esmond Plants, M.D., Ph.D. [place], [place]

## 2019-12-15 ENCOUNTER — Other Ambulatory Visit: Payer: BC Managed Care – PPO

## 2020-07-24 ENCOUNTER — Ambulatory Visit: Payer: BC Managed Care – PPO | Admitting: Adult Health

## 2020-09-25 ENCOUNTER — Ambulatory Visit: Payer: BC Managed Care – PPO | Admitting: Adult Health

## 2020-11-10 ENCOUNTER — Other Ambulatory Visit: Payer: Self-pay

## 2020-11-10 DIAGNOSIS — D751 Secondary polycythemia: Secondary | ICD-10-CM

## 2020-11-13 ENCOUNTER — Other Ambulatory Visit: Payer: Self-pay

## 2020-11-13 ENCOUNTER — Encounter: Payer: Self-pay | Admitting: Oncology

## 2020-11-13 ENCOUNTER — Inpatient Hospital Stay: Payer: BC Managed Care – PPO | Attending: Oncology

## 2020-11-13 ENCOUNTER — Inpatient Hospital Stay (HOSPITAL_BASED_OUTPATIENT_CLINIC_OR_DEPARTMENT_OTHER): Payer: BC Managed Care – PPO | Admitting: Oncology

## 2020-11-13 VITALS — BP 134/97 | HR 87 | Temp 98.1°F | Wt 200.6 lb

## 2020-11-13 DIAGNOSIS — Z1501 Genetic susceptibility to malignant neoplasm of breast: Secondary | ICD-10-CM | POA: Insufficient documentation

## 2020-11-13 DIAGNOSIS — R198 Other specified symptoms and signs involving the digestive system and abdomen: Secondary | ICD-10-CM | POA: Diagnosis not present

## 2020-11-13 DIAGNOSIS — D751 Secondary polycythemia: Secondary | ICD-10-CM

## 2020-11-13 DIAGNOSIS — R911 Solitary pulmonary nodule: Secondary | ICD-10-CM | POA: Diagnosis not present

## 2020-11-13 DIAGNOSIS — R7989 Other specified abnormal findings of blood chemistry: Secondary | ICD-10-CM

## 2020-11-13 DIAGNOSIS — Z1509 Genetic susceptibility to other malignant neoplasm: Secondary | ICD-10-CM

## 2020-11-13 DIAGNOSIS — Z9013 Acquired absence of bilateral breasts and nipples: Secondary | ICD-10-CM | POA: Insufficient documentation

## 2020-11-13 DIAGNOSIS — Z853 Personal history of malignant neoplasm of breast: Secondary | ICD-10-CM | POA: Insufficient documentation

## 2020-11-13 DIAGNOSIS — K921 Melena: Secondary | ICD-10-CM

## 2020-11-13 DIAGNOSIS — Z7289 Other problems related to lifestyle: Secondary | ICD-10-CM | POA: Insufficient documentation

## 2020-11-13 LAB — COMPREHENSIVE METABOLIC PANEL
ALT: 23 U/L (ref 0–44)
AST: 22 U/L (ref 15–41)
Albumin: 4.4 g/dL (ref 3.5–5.0)
Alkaline Phosphatase: 46 U/L (ref 38–126)
Anion gap: 8 (ref 5–15)
BUN: 14 mg/dL (ref 6–20)
CO2: 28 mmol/L (ref 22–32)
Calcium: 9.3 mg/dL (ref 8.9–10.3)
Chloride: 99 mmol/L (ref 98–111)
Creatinine, Ser: 1.09 mg/dL — ABNORMAL HIGH (ref 0.44–1.00)
GFR, Estimated: >60 mL/min (ref >60)
Glucose, Bld: 96 mg/dL (ref 70–99)
Potassium: 4.1 mmol/L (ref 3.5–5.1)
Sodium: 135 mmol/L (ref 135–145)
Total Bilirubin: 0.7 mg/dL (ref 0.3–1.2)
Total Protein: 7.8 g/dL (ref 6.5–8.1)

## 2020-11-13 LAB — CBC WITH DIFFERENTIAL/PLATELET
Abs Immature Granulocytes: 0.02 10*3/uL (ref 0.00–0.07)
Basophils Absolute: 0.1 10*3/uL (ref 0.0–0.1)
Basophils Relative: 1 %
Eosinophils Absolute: 0.1 10*3/uL (ref 0.0–0.5)
Eosinophils Relative: 2 %
HCT: 43.5 % (ref 36.0–46.0)
Hemoglobin: 14.9 g/dL (ref 12.0–15.0)
Immature Granulocytes: 0 %
Lymphocytes Relative: 29 %
Lymphs Abs: 1.9 10*3/uL (ref 0.7–4.0)
MCH: 30.2 pg (ref 26.0–34.0)
MCHC: 34.3 g/dL (ref 30.0–36.0)
MCV: 88.2 fL (ref 80.0–100.0)
Monocytes Absolute: 0.5 10*3/uL (ref 0.1–1.0)
Monocytes Relative: 7 %
Neutro Abs: 3.9 10*3/uL (ref 1.7–7.7)
Neutrophils Relative %: 61 %
Platelets: 219 10*3/uL (ref 150–400)
RBC: 4.93 MIL/uL (ref 3.87–5.11)
RDW: 12.5 % (ref 11.5–15.5)
WBC: 6.4 10*3/uL (ref 4.0–10.5)
nRBC: 0 % (ref 0.0–0.2)

## 2020-11-13 NOTE — Progress Notes (Signed)
Hematology/Oncology follow up note [facility] Telephone:([phone] [phone]   Patient Care Team: Wille Glaser, MD as PCP - General (Internal Medicine)  REFERRING PROVIDER: Wille Glaser, MD  CHIEF COMPLAINTS/REASON FOR VISIT:  Follow up for history of breast cancer and breast pain  HISTORY OF PRESENTING ILLNESS:   Sheila Haney is a  45 y.o.  female with PMH listed below was seen in consultation at the request of  Wille Glaser, MD  for evaluation of history of breast cancer and breat pain.  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  Also reports to have right breast precancer lesions.  She was 33 at that time.  Genetic testing was done and was positive for BRCA1 mutation. Patient reports that she had bilateral mastectomy with sentinel lymph node biopsy.  She also reports history of total hysterectomy and oophorectomy Post surgery,  She received ddAC followed by Taxol, and then 5 years of tamoxifen. Her oncology care was by Dr. Fabienne Bruns in [place].  [facility]  She then moved to [place] and she obtained medical advice after experienced right chest wall discomforts.  She had a PET scan done which did not show any bone problem but per patient she had abnormal lung findings for which she got biopsy.  Biopsy was negative for malignancy.  She moved to [place] recently and was referred to establish care with oncology and also patient reports 4 to 5 weeks of left rib cage dull pain which did not go away after taking Tylenol.  No other bone pain.  Worsened with deep breath.  # BRCA 1 positivity, I was not able to obtain any records from oncologist as the clinic has permanently closed. Discussed with  the patient about genetic counselor for retesting and she declines.  11/11/2019 PET scan was independent reviewed by me and discussed with the patient.No etiology to explain her chest pain. No evidence of breast cancer metastasis which is reassuring.   INTERVAL HISTORY Sheila Haney is a 45 y.o. female who has above history reviewed by me today presents for follow up visit for management of history of breast cancer, BRCA1 mutation  No chest pain or chest wall concerns.  + blood in stool  Review of Systems  Constitutional:  Negative for appetite change, chills, fatigue and fever.  HENT:   Negative for hearing loss and voice change.   Eyes:  Negative for eye problems.  Respiratory:  Negative for chest tightness and cough.   Cardiovascular:  Negative for chest pain.  Gastrointestinal:  Negative for abdominal distention, abdominal pain and blood in stool.  Endocrine: Negative for hot flashes.  Genitourinary:  Negative for difficulty urinating and frequency.   Musculoskeletal:  Negative for arthralgias.  Skin:  Negative for itching and rash.  Neurological:  Negative for extremity weakness.  Hematological:  Negative for adenopathy.  Psychiatric/Behavioral:  Negative for confusion.  See HPI  MEDICAL HISTORY:  Past Medical History:  Diagnosis Date   Breast cancer ([place]) 10/21/2008   Personal history of chemotherapy     SURGICAL HISTORY: Past Surgical History:  Procedure Laterality Date   MASTECTOMY Bilateral 2011   TOTAL ABDOMINAL HYSTERECTOMY  12/06/2008    SOCIAL HISTORY: Social History   Socioeconomic History   Marital  status: Soil scientist    Spouse name: Not on file   Number of children: Not on file   Years of education: Not on file   Highest education level: Not on file  Occupational History   Not on file  Tobacco Use   Smoking status: Never   Smokeless tobacco: Never  Vaping Use   Vaping Use: Never used  Substance and Sexual Activity   Alcohol use: Yes     Alcohol/week: 6.0 standard drinks    Types: 6 Glasses of wine per week   Drug use: Never   Sexual activity: Not on file  Other Topics Concern   Not on file  Social History Narrative   Not on file   Social Determinants of Health   Financial Resource Strain: Not on file  Food Insecurity: Not on file  Transportation Needs: Not on file  Physical Activity: Not on file  Stress: Not on file  Social Connections: Not on file  Intimate Partner Violence: Not on file    FAMILY HISTORY: Family History  Family history unknown: Yes    ALLERGIES:  has No Known Allergies.  MEDICATIONS:  Current Outpatient Medications  Medication Sig Dispense Refill   citalopram (CELEXA) 20 MG tablet Take 20 mg by mouth daily.      fluticasone (FLONASE) 50 MCG/ACT nasal spray 1 spray by Both Nostrils route daily.     loratadine (CLARITIN) 10 MG tablet Take 10 mg by mouth daily.      LORazepam (ATIVAN) 1 MG tablet Take by mouth every 6 (six) hours as needed.      Multiple Vitamins-Minerals (MULTIVITAMIN WITH MINERALS) tablet Take 1 tablet by mouth daily.     pantoprazole (PROTONIX) 40 MG tablet Take 40 mg by mouth daily.      traZODone (DESYREL) 50 MG tablet Take 1.5-2 tablets at bedtime as needed     No current facility-administered medications for this visit.     PHYSICAL EXAMINATION: ECOG PERFORMANCE STATUS: 0 - Asymptomatic Vitals:   11/13/20 1323  BP: (!) 134/97  Pulse: 87  Temp: 98.1 F (36.7 C)  SpO2: 98%   Filed Weights   11/13/20 1323  Weight: 200 lb 9.6 oz (91 kg)    Physical Exam Constitutional:      General: She is not in acute distress. HENT:     Head: Normocephalic and atraumatic.  Eyes:     General: No scleral icterus. Cardiovascular:     Rate and Rhythm: Normal rate and regular rhythm.     Heart sounds: Normal heart sounds.  Pulmonary:     Effort: Pulmonary effort is normal. No respiratory distress.     Breath sounds: No wheezing.  Abdominal:     General: Bowel  sounds are normal. There is no distension.     Palpations: Abdomen is soft.     Comments: Palpable liver 1cm below costal margin.   Musculoskeletal:        General: No deformity. Normal range of motion.     Cervical back: Normal range of motion and neck supple.  Skin:    General: Skin is warm and dry.     Findings: No erythema or rash.  Neurological:     Mental Status: She is alert and oriented to person, place, and time. Mental status is at baseline.     Cranial Nerves: No cranial nerve deficit.     Coordination: Coordination normal.  Psychiatric:        Mood and Affect: Mood normal.  Patient has history of bilateral mastectomy,  LABORATORY DATA:  I have reviewed the data as listed Lab Results  Component Value Date   WBC 6.4 11/13/2020   HGB 14.9 11/13/2020   HCT 43.5 11/13/2020   MCV 88.2 11/13/2020   PLT 219 11/13/2020   Recent Labs    11/15/19 1008 11/13/20 1307  NA 138 135  K 4.2 4.1  CL 104 99  CO2 28 28  GLUCOSE 99 96  BUN 14 14  CREATININE 1.05* 1.09*  CALCIUM 8.9 9.3  GFRNONAA >60 >60  PROT  --  7.8  ALBUMIN  --  4.4  AST  --  22  ALT  --  23  ALKPHOS  --  46  BILITOT  --  0.7    Iron/TIBC/Ferritin/ %Sat No results found for: IRON, TIBC, FERRITIN, IRONPCTSAT    RADIOGRAPHIC STUDIES: I have personally reviewed the radiological images as listed and agreed with the findings in the report. No results found.     ASSESSMENT & PLAN:  1. BRCA1 positive   2. Lung nodule   3. Edge of liver palpable below right costal margin   4. Elevated serum creatinine   5. Blood in stool    #Self-reported history of left breast ER positive HER-2 negative cancer status post mastectomy and sentinel lymph node biopsy followed by adjuvant ddAC-T chemotherapy and 5 years of tamoxifen. BRCA1 mutation.  Recommend annual dermatology evaluation.  Labs are reviewed and discussed with patient.  # blood in stool, refer to GI   #Small lung nodules, which were also  present in her previous PET scan.   Repeat CT chest wo contrast for follow up .   # Palpable liver edge below costal margin # Atherosclerosis plaque, follows up with cardiology.  History of bilateral mastectomy and per patient bilateral oophorectomy and hysterectomy. Bone density was checked in June 2021 which was normal. Continue calcium and vitamin D supplementation.   Elevated creatinine, advised oral hydration.  Avoid nephrotoxins. Recommend her to follow up with PCP   Orders Placed This Encounter  Procedures   CT Chest Wo Contrast    Standing Status:   Future    Standing Expiration Date:   11/13/2021    Order Specific Question:   Is patient pregnant?    Answer:   No    Order Specific Question:   Preferred imaging location?    Answer:   [facility]   US Abdomen Limited RUQ (LIVER/GB)    Standing Status:   Future    Standing Expiration Date:   11/13/2021    Order Specific Question:   Reason for Exam (SYMPTOM  OR DIAGNOSIS REQUIRED)    Answer:   RUQ pain    Order Specific Question:   Preferred imaging location?    Answer:   [facility]   CBC with Differential/Platelet    Standing Status:   Future    Standing Expiration Date:   11/13/2021   Comprehensive metabolic panel    Standing Status:   Future    Standing Expiration Date:   11/13/2021   Ambulatory referral to Gastroenterology    Referral Priority:   Routine    Referral Type:   Consultation    Referral Reason:   Specialty Services Required    Referred to Provider:   Jonathon Bellows, MD    Number of Visits Requested:   1    All questions were answered. The patient knows to call the clinic with any problems questions or  concerns.  Return of visit:  Follow-up in 1 year    Earlie Server, MD, PhD 11/13/2020

## 2020-11-21 ENCOUNTER — Other Ambulatory Visit: Payer: Self-pay

## 2020-11-21 ENCOUNTER — Ambulatory Visit
Admission: RE | Admit: 2020-11-21 | Discharge: 2020-11-21 | Disposition: A | Discharge status: Home / Self Care | Payer: BC Managed Care – PPO | Source: Ambulatory Visit | Attending: Oncology | Admitting: Oncology

## 2020-11-21 DIAGNOSIS — R911 Solitary pulmonary nodule: Secondary | ICD-10-CM | POA: Insufficient documentation

## 2020-11-21 DIAGNOSIS — R198 Other specified symptoms and signs involving the digestive system and abdomen: Secondary | ICD-10-CM | POA: Insufficient documentation

## 2021-01-08 ENCOUNTER — Ambulatory Visit: Payer: BC Managed Care – PPO | Admitting: Gastroenterology

## 2021-02-26 ENCOUNTER — Ambulatory Visit: Payer: BC Managed Care – PPO | Admitting: Gastroenterology

## 2021-08-03 ENCOUNTER — Ambulatory Visit
Admit: 2021-08-03 | Discharge: 2021-08-03 | Payer: BLUE CROSS/BLUE SHIELD | Attending: Family Medicine | Primary: Family Medicine

## 2021-08-03 ENCOUNTER — Encounter

## 2021-08-03 DIAGNOSIS — K625 Hemorrhage of anus and rectum: Secondary | ICD-10-CM

## 2021-08-03 LAB — LIPID PANEL
Chol/HDL Ratio: 3.9 (ref 0.0–4.4)
Cholesterol: 221 mg/dL — ABNORMAL HIGH (ref 100–200)
HDL: 56 mg/dL (ref >=50)
LDL Cholesterol: 133.4 mg/dL — ABNORMAL HIGH (ref 0.0–100.0)
LDL/HDL Ratio: 2.4
Triglycerides: 158 mg/dL — ABNORMAL HIGH (ref 0–149)
VLDL: 31.6 mg/dL (ref 5.0–40.0)

## 2021-08-03 LAB — CBC WITH AUTO DIFFERENTIAL
Absolute Baso #: 0 10*3/uL (ref 0.0–0.2)
Absolute Eos #: 0.1 10*3/uL (ref 0.0–0.5)
Absolute Lymph #: 1.5 10*3/uL (ref 1.0–3.2)
Absolute Mono #: 0.5 10*3/uL (ref 0.3–1.0)
Basophils %: 0.5 % (ref 0.0–2.0)
Eosinophils %: 1.2 % (ref 0.0–7.0)
Hematocrit: 45.5 % (ref 34.0–47.0)
Hemoglobin: 15.3 g/dL (ref 11.5–15.7)
Immature Grans (Abs): 0.02 10*3/uL (ref 0.00–0.06)
Immature Granulocytes: 0.3 % (ref 0.0–0.6)
Lymphocytes: 19.1 % (ref 15.0–45.0)
MCH: 29.7 pg (ref 27.0–34.5)
MCHC: 33.6 g/dL (ref 32.0–36.0)
MCV: 88.2 fL (ref 81.0–99.0)
MPV: 10.7 fL (ref 7.2–13.2)
Monocytes: 6 % (ref 4.0–12.0)
NRBC Absolute: 0 10*3/uL (ref 0.000–0.012)
NRBC Automated: 0 % (ref 0.0–0.2)
Neutrophils %: 72.9 % (ref 42.0–74.0)
Neutrophils Absolute: 5.7 10*3/uL (ref 1.6–7.3)
Platelets: 246 10*3/uL (ref 140–440)
RBC: 5.16 x10e6/mcL (ref 3.60–5.20)
RDW: 12.2 % (ref 11.0–16.0)
WBC: 7.8 10*3/uL (ref 3.8–10.6)

## 2021-08-03 LAB — COMPREHENSIVE METABOLIC PANEL
ALT: 21 U/L (ref 0–35)
AST: 23 U/L (ref 0–35)
Albumin/Globulin Ratio: 2 (ref 1.00–2.70)
Albumin: 4.7 g/dL (ref 3.5–5.2)
Alk Phosphatase: 65 U/L (ref 35–117)
Anion Gap: 14 mmol/L (ref 2–17)
BUN: 15 mg/dL (ref 6–20)
CO2: 25 mmol/L (ref 22–29)
Calcium: 9.4 mg/dL (ref 8.6–10.0)
Chloride: 101 mmol/L (ref 98–107)
Creatinine: 1 mg/dL (ref 0.5–1.0)
Est, Glom Filt Rate: 71 mL/min/1.73m (ref >=60)
Globulin: 2.4 g/dL (ref 1.9–4.4)
Glucose: 101 mg/dL — ABNORMAL HIGH (ref 70–99)
OSMOLALITY CALCULATED: 280 mOsm/kg (ref 270–287)
Potassium: 4.1 mmol/L (ref 3.5–5.3)
Sodium: 140 mmol/L (ref 135–145)
Total Bilirubin: 0.41 mg/dL (ref 0.00–1.20)
Total Protein: 7.1 g/dL (ref 6.4–8.3)

## 2021-08-03 LAB — TSH WITH REFLEX: TSH: 2.85 mcIU/mL (ref 0.358–3.740)

## 2021-08-03 MED ORDER — ONDANSETRON 4 MG PO TBDP
4 MG | ORAL_TABLET | Freq: Three times a day (TID) | ORAL | 0 refills | Status: AC | PRN
Start: 2021-08-03 — End: 2021-08-20

## 2021-08-03 MED ORDER — TRAZODONE HCL 100 MG PO TABS
100 MG | ORAL_TABLET | ORAL | 1 refills | Status: AC
Start: 2021-08-03 — End: 2022-01-29

## 2021-08-03 MED ORDER — CITALOPRAM HYDROBROMIDE 20 MG PO TABS
20 MG | ORAL_TABLET | Freq: Every day | ORAL | 1 refills | Status: AC
Start: 2021-08-03 — End: 2022-01-29

## 2021-08-03 MED ORDER — PANTOPRAZOLE SODIUM 40 MG PO TBEC
40 MG | ORAL_TABLET | ORAL | 1 refills | Status: DC
Start: 2021-08-03 — End: 2021-08-09

## 2021-08-03 MED ORDER — PANTOPRAZOLE SODIUM 40 MG PO TBEC
40 MG | ORAL_TABLET | ORAL | 1 refills | Status: DC
Start: 2021-08-03 — End: 2021-08-03

## 2021-08-03 NOTE — Progress Notes (Addendum)
Chief Complaint:     New Patient  Multiple complaints.        ASSESSMENT/PLAN:    ICD-10-CM    1. Gastroesophageal reflux disease, unspecified whether esophagitis present  K21.9 pantoprazole (PROTONIX) 40 MG tablet      2. Insomnia, unspecified type  G47.00 traZODone (DESYREL) 100 MG tablet      3. Anxiety and depression  F41.9 citalopram (CELEXA) 20 MG tablet    F32.A CBC with Auto Differential     Comprehensive Metabolic Panel     TSH with Reflex ([facility])     Lipase      4. Obsessive-compulsive disorder, unspecified type  F42.9       5. Indigestion  K30 External Referral To Gastroenterology      6. Change in bowel habits  R19.4 External Referral To Gastroenterology      7. Bright red blood per rectum  K62.5 External Referral To Gastroenterology      8. Right upper quadrant pain  R10.11 External Referral To Gastroenterology     US ABDOMEN LIMITED     CBC with Auto Differential     Comprehensive Metabolic Panel     Lipase      9. History of breast cancer  Z85.3 External Referral To Hematology/Oncology      10. BRCA1 gene mutation positive in female  Z15.01 External Referral To Hematology/Oncology    Z15.02     Z15.09       11. Class 1 obesity due to excess calories without serious comorbidity with body mass index (BMI) of 30.0 to 30.9 in adult  E66.09     Z68.30       12. Hyperlipidemia, unspecified hyperlipidemia type  E78.5 Lipid Panel        Will request previous records. Will check routine labs and labs  to evaluated current complaints. Check RUQ ultrasound to evaluate her RUQ pain. Will refer to GI for evaluation of abdominal pain and brbpr as well. Discussed increasing protonix to BID. Mood is stable. Discussed trial of taking trazodone 150 mg for sleep. Will refer to local oncology to establish care. She has appointment to see dermatology upcoming.     Addedum: Patient needs BID pantoprazole. Patient has tried and failed prevacid, prilosec. Pantoprazole has been helpful, but having recent flare of symptoms.        Return in about 6 weeks (around 09/14/2021).         Subjective   SUBJECTIVE/OBJECTIVE:    Patient with past medical history breast cancer, + BRCA 1, GERD, insomnia, depression/anxiety, insomnia is here to establish care.     Notes her mood has been stable. Has been having trouble sleeping.     Notes she has issues with constipation. Notes as soon as she eats she has been having BM for past 3 weeks. Notes she has had bleeding-- bright red blood with some clots in it. Blood with wiping and in the toilet., Notes she had episode of indigestion and chest pain.         ROS as per HPI or otherwise negative.           Objective   Vitals:    08/03/21 0845   BP: 114/80   Pulse: (!) 108   Resp: 18   Temp: 98 F (36.7 C)   SpO2: 96%       GENERAL: The patient is in no apparent distress. Alert and oriented. Vital Signs Reviewed HEENT: Head is normocephalic and atraumatic. Extraocular muscles  are intact. Pupils are equal, Conjunctiva normal. Moist Mucous membranes. Posterior pharynx clear of any exudate or lesions. NECK: Supple. No Lymphadenopathy LUNGS: Clear to auscultation bilaterally. Breath Sounds equal. HEART: Regular rate and rhythm without murmur. No edema ABDOMEN: Soft,  slight tender to palpation right upper quadrant. , and nondistended. . Musculoskeletal: No deformity. Gait WNL. No swelling noted. NEUROLOGIC: Alert and Oriented. No focal deficits. PSYCHIATRIC: Cooperative, mood appropriate. SKIN: No rash noted.                 An electronic signature was used to authenticate this note.    --Earlene Plater, MD

## 2021-08-03 NOTE — Progress Notes (Deleted)
ERROR

## 2021-08-04 ENCOUNTER — Encounter

## 2021-08-04 ENCOUNTER — Inpatient Hospital Stay: Admit: 2021-08-04 | Payer: BLUE CROSS/BLUE SHIELD | Attending: Family Medicine | Primary: Family Medicine

## 2021-08-04 DIAGNOSIS — R1011 Right upper quadrant pain: Secondary | ICD-10-CM

## 2021-08-04 LAB — LIPASE: Lipase: 38 U/L (ref 13–60)

## 2021-08-06 NOTE — Telephone Encounter (Signed)
From: Maryelizabeth Rowan  Sent: 08/06/2021 8:17 AM EDT  To: [facility] Clinical Staff  Subject: Ultrasound results.    Thank you so much! I'll call now. Can you also give me the GI doctor's name and number? I'm still in pain after I eat and am still having the diarrhea after I eat.

## 2021-08-06 NOTE — Addendum Note (Signed)
Addended by: Gabriel Cirri on: 08/06/2021 08:14 AM     Modules accepted: Orders

## 2021-08-06 NOTE — Telephone Encounter (Signed)
Sent information - FYI related to symptoms.

## 2021-08-09 MED ORDER — PANTOPRAZOLE SODIUM 40 MG PO TBEC
40 MG | ORAL_TABLET | ORAL | 5 refills | Status: AC
Start: 2021-08-09 — End: 2021-11-27

## 2021-08-09 NOTE — Telephone Encounter (Signed)
From: MA Glenis Musolf C  To: Marquis Diles  Sent: 08/03/2021 12:43 PM EDT  Subject: medications    We received a fax from the pharmacy regarding your protonix prescription Dr Laurence Compton sent over. Since she increased it to twice a day your insurance is requiring a prior authorization. Before I submit I need to know what other medications you have tried in the past for your reflux. Let me know when you have a moment and I will submit the authorization.    Thanks,  Verina Galeno,CMA

## 2021-08-13 ENCOUNTER — Inpatient Hospital Stay: Admit: 2021-08-13 | Payer: BLUE CROSS/BLUE SHIELD | Attending: Family Medicine | Primary: Family Medicine

## 2021-08-13 DIAGNOSIS — R1011 Right upper quadrant pain: Secondary | ICD-10-CM

## 2021-08-13 MED ORDER — SODIUM CHLORIDE 0.9 % IV SOLN
0.9 | Freq: Once | INTRAVENOUS | Status: AC
Start: 2021-08-13 — End: 2021-08-13
  Administered 2021-08-13: 14:00:00 1.86 ug via INTRAVENOUS

## 2021-08-13 MED ORDER — SINCALIDE 5 MCG IJ SOLR
5 MCG | Freq: Once | INTRAMUSCULAR | Status: DC
Start: 2021-08-13 — End: 2021-08-13

## 2021-08-13 MED ORDER — TECHNETIUM TC 99M MEBROFENIN
Freq: Once | Status: AC | PRN
Start: 2021-08-13 — End: 2021-08-13
  Administered 2021-08-13: 12:00:00 8.8 via INTRAVENOUS

## 2021-08-13 MED FILL — KINEVAC 5 MCG IJ SOLR: 5 MCG | INTRAMUSCULAR | Qty: 1.86

## 2021-08-14 NOTE — Telephone Encounter (Signed)
From: Maryelizabeth Rowan  Sent: 08/14/2021 5:39 PM EDT  To: [facility] Clinical Staff  Subject: Lab results.    Thank you for the follow up. Are you at all concerned with that kidney function number?     I had my HIDA yesterday and today went for a CT with contrast (that was done by the GI dr). Aug 2 I have an upper endoscopy and a colonoscopy.

## 2021-08-17 ENCOUNTER — Encounter

## 2021-08-19 ENCOUNTER — Encounter: Payer: Self-pay | Admitting: Oncology

## 2021-08-20 ENCOUNTER — Encounter

## 2021-08-20 ENCOUNTER — Ambulatory Visit
Admit: 2021-08-20 | Discharge: 2021-08-20 | Payer: BLUE CROSS/BLUE SHIELD | Attending: Hematology & Oncology | Primary: Family Medicine

## 2021-08-20 DIAGNOSIS — Z853 Personal history of malignant neoplasm of breast: Secondary | ICD-10-CM

## 2021-08-20 LAB — CBC WITH AUTO DIFFERENTIAL
Absolute Baso #: 0.1 10*3/uL (ref 0.0–0.1)
Absolute Eos #: 0.2 10*3/uL (ref 0.0–0.6)
Absolute Lymph #: 2.1 10*3/uL (ref 0.5–5.0)
Basophils %: 0.8 % (ref 0.0–1.2)
Eosinophils %: 2 % (ref 0.0–5.4)
Hematocrit: 44.6 % — ABNORMAL HIGH (ref 33.4–44.1)
Hemoglobin: 14.4 g/dL (ref 11.4–15.3)
Lymphocytes: 26.2 % (ref 14.8–45.8)
MCH: 29 pg (ref 27–33)
MCHC: 32.3 g/dL — ABNORMAL LOW (ref 32.5–34.8)
MCV: 91 fL (ref 81–97)
MPV: 10.5 fL — ABNORMAL HIGH (ref 7.00–10.00)
Monocytes Absolute: 0.5 10*3/uL (ref 0.1–1.3)
Monocytes: 6 % (ref 4.0–12.7)
Neutrophils %: 65 % (ref 40.1–76.4)
Neutrophils Absolute: 5.1 10*3/uL (ref 1.3–8.4)
Platelets: 237 10*3/uL (ref 142–424)
RBC: 4.9 M/uL (ref 3.60–4.99)
RDW: 12.7 % (ref 11.6–14.8)
WBC: 7.8 10*3/uL (ref 3.2–12.0)

## 2021-08-20 LAB — COMPREHENSIVE METABOLIC PANEL
ALT: 17 U/L (ref 0–35)
AST: 14 U/L (ref 0–35)
Albumin: 4.6 g/dl (ref 3.50–5.20)
Alk Phosphatase: 55 U/L (ref 35–117)
Anion Gap: 10.9
BUN: 11.6 mg/dl (ref 6.0–20.0)
CO2: 25.8 mmol/L (ref 22.0–29.0)
Calcium: 9.99 mg/dl (ref 8.60–10.00)
Chloride: 101.3 mmol/L (ref 98.0–107.0)
Creatinine: 1.02 mg/dl — ABNORMAL HIGH (ref 0.50–1.00)
Est, Glom Filt Rate: 58.84
Glucose: 107 mg/dl — ABNORMAL HIGH (ref 70.0–99.0)
Potassium: 3.49 mmol/L — ABNORMAL LOW (ref 3.50–5.30)
Sodium: 138 mmol/L (ref 135.0–145.0)
Total Bilirubin: 0.42 mg/dl (ref 0.00–1.20)
Total Protein: 7 g/dL (ref 6.30–8.20)

## 2021-08-20 NOTE — Telephone Encounter (Signed)
Thank you :)

## 2021-08-20 NOTE — Progress Notes (Signed)
Initial Consultation Note -- Hematology/ Medical Oncology    Patient Name: Susan Hardy Attending:  Belva Crome, MD   DOB: 08/12/1975  Age:46 y.o. 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   2016: PET showed 16m lung nodule    [place]  Pt reports recent CT c/a/p 467mlung nodule, 1136mbnormality left pelvis  History of Present Illness   Susan Hardy here today for consultation and to establish survivorship care. [place] by Dr. BasBenjie Karvonen gastroenterology for recent concerns about RUQ pain as well as lower pelvic discomfort and BRBPR. He ordered a CT that showed an abnormality initially reported to be in the left ovary, however she has no ovary, having undergone TAH BSO when she was found to be BRCA positive with the breast cancer diagnosis. I do not have those reports for review. She is scheduled for upper and lower endoscopy on August 2 with Dr. BasBenjie Karvonennd has been referred to Dr. JenCreig Hinest does not yet have an appointment. Her recent annual physical labs with Dr. CriJuanita Craverre unremarkable. She does not recall if she had tumor markers monitored with her initial team. Previous records have been requested but I do not have them for review.        Past Medical History     Past Medical History:   Diagnosis Date    Anxiety 06/2006    [place]    Breast    Depression 06/2006    GERD (gastroesophageal reflux disease) 07/09/2021    Loose stool, blood    Neuropathy  01/2009    Restless legs syndrome 06/2009        Past Surgical History     Past Surgical History:   Procedure Laterality Date    HYSTERECTOMY, TOTAL ABDOMINAL (CERVIX REMOVED)  11/2008    MASTECTOMY, BILATERAL  11/2008        Family History     Family History   Problem Relation Age of Onset    Thyroid Disease Mother     Lupus Mother     Rheum Arthritis Mother     Unknown Father         Social History     Social History     Tobacco Use   Smoking Status Never   Smokeless Tobacco Never      Social History     Substance and Sexual Activity   Alcohol Use Yes    Comment: Socially          Allergies     Allergies   Allergen Reactions    Lavender Oil Hives       Review of Systems   As mentioned in the history of present illness. All remaining 10 point systems were reviewed and remain negative.     Medications     Current Outpatient Medications   Medication Sig Dispense  Refill    pantoprazole (PROTONIX) 40 MG tablet Take 1 tablet orally twice day 60 tablet 5    fluticasone (FLONASE) 50 MCG/ACT nasal spray 1 spray each nostril once a day as needed      citalopram (CELEXA) 20 MG tablet Take 1 tablet by mouth daily 90 tablet 1    traZODone (DESYREL) 100 MG tablet Take 1 tablet orally once a day 90 tablet 1    loratadine (CLARITIN) 10 MG tablet Take 1 tablet orally once a day       No current facility-administered medications for this visit.       Physical Exam   BP 114/78   Pulse 86   Resp 16   Ht _0  (1.778 m)   Wt 209 lb (94.8 kg)   BMI 29.99 kg/m      This is a 46 y.o. female patient in no acute distress.   She answers my questions appropriately.  She is awake, alert and oriented.   HEENT: Normal conjunctiva and lids. Oropharynx unremarkable, Neck without cervical or supraclavicular adenopathy or mass.   Pulmonary: Symmetric expansion, no accessory muscle use, no distress  Cardiovascular:  RRR, no peripheral edema  Abdomen : Soft, non-tender, no mass or hepatosplenomegaly, normal bowel sounds.   Skin: no rash, no  petechiae, no ecchymosis, no pallor  Musculoskeletal: normal muscle strength  Neurological: no focal deficit, normal gait, alert & oriented x 3       Labs/Imaging:     Lab Results   Component Value Date/Time    WBC 7.8 08/03/2021 09:45 AM    HGB 15.3 08/03/2021 09:45 AM    HCT 45.5 08/03/2021 09:45 AM    PLT 246 08/03/2021 09:45 AM    MCV 88.2 08/03/2021 09:45 AM       Lab Results   Component Value Date/Time    NA 140 08/03/2021 09:45 AM    K 4.1 08/03/2021 09:45 AM    CL 101 08/03/2021 09:45 AM    CO2 25 08/03/2021 09:45 AM    BUN 15 08/03/2021 09:45 AM    GLOB 2.4 08/03/2021 09:45 AM    ALT 21 08/03/2021 09:45 AM         Visit Diagnoses     1. History of breast cancer    2. BRCA1 positive          ASSESSMENT AND PLAN   A very  nice 46 yo woman with a history of BRCA-1 + breast cancer, treated in 2011. We will check tumor markers today including a CA 27-29 and CA 125. She does need to establish with a gynecologist and will reach out to Dr Creig Hines' office to make an appointment soon. We will continue annual follow up and she is in agreement.    Plan:  Labs today CA 125 and CA 27-9   GI workup is ongoing with Dr. Everardo Beals referral has been made to Dr. Creig Hines  No imaging given bilateral mastectomy  RTC 1 year      Orders Placed This Encounter    Cancer Antigen 27.29     Standing Status:   Future     Number of Occurrences:   1     Standing Expiration Date:   08/21/2022    CA 125     Standing Status:   Future     Number of Occurrences:   1     Standing Expiration Date:   08/21/2022

## 2021-08-22 ENCOUNTER — Encounter

## 2021-08-22 LAB — CANCER ANTIGEN 27.29: CA 27.29: 26.5 U/ml (ref 0.0–37.7)

## 2021-08-22 LAB — CA 125: CA-125: 10 unit/mL (ref 0.0–38.1)

## 2021-08-28 ENCOUNTER — Ambulatory Visit
Admit: 2021-08-28 | Discharge: 2021-08-28 | Payer: BLUE CROSS/BLUE SHIELD | Attending: Surgery | Primary: Family Medicine

## 2021-08-28 DIAGNOSIS — R1013 Epigastric pain: Secondary | ICD-10-CM

## 2021-08-28 NOTE — H&P (View-Only) (Signed)
[facility] Note                HPI: Susan Hardy is a 46 y.o. female who presents for evaluation of several weeks of postprandial upper abdominal low chest pain.  She states this is been relatively reliably following meals and is especially noise some after fatty meals.  Has also had fairly profound diarrhea associated with this.  She does state that diarrhea can sometimes precede pain and vice versa.  Likewise has generalized weakness.  She apparently also relates a history of bright red blood per rectum and is scheduled for upper and lower endoscopy in the coming days.    Due to epigastric right upper quadrant postprandial symptoms, she did undergo gallbladder ultrasound which was normal and stimulated HIDA scan revealed ejection fraction of 31%.    Past medical history is significant for breast cancer.  Identifies history of GERD as concomitant with adjuvant chemotherapy.    Exam.  Alert.  Comfortable.  Abdomen at present entirely soft.    Imaging noted above.  HIDA scan 31% the patient does state that it did reproduce her symptoms.  Also has documented bowel reflux prior to obtaining CCK.  CT scan also obtained which shows no acute findings.    Assessment.  Married GI symptoms including some suggestive of biliary disease and others, diarrhea and blood not suggestive.  Difficulties of diagnosis with above radiographic findings especially in the setting of additional GI symptoms discussed at great length with patient.  Usual discussion regarding presumed acalculous biliary symptoms gone over in detail.    Patient does have fairly typical symptoms but again these are just part of her overall issue making attribution to the biliary tract somewhat difficult.  Additionally radiographic evidence of bile reflux further confuses the issue.  I recommended to the patient that she have her upper and lower endoscopy which is scheduled in the coming days.  In the interim if she has 1 of these  postprandial episodes have asked her to try a binding antiacid such as Mylanta or Tums to see if that may mitigate any symptoms that could be caused from bile reflux.  Additionally upper GI endoscopy may shed light on anatomic changes of bowel reflux.  She is to return for further discussion following tests and therapeutic trial                                    Paelyn Smick C Nick Stults, MD      NOTE: This report, in part or full,may have been transcribed using voice recognition software. Every effort was made to ensure accuracy; however, inadvertent computerized transcription errors may be present. Please excuse any transcriptional grammatical or spelling errors that may have escaped my editorial review.

## 2021-08-28 NOTE — Progress Notes (Signed)
[facility] Note                HPI: Susan Hardy is a 46 y.o. female who presents for evaluation of several weeks of postprandial upper abdominal low chest pain.  She states this is been relatively reliably following meals and is especially noise some after fatty meals.  Has also had fairly profound diarrhea associated with this.  She does state that diarrhea can sometimes precede pain and vice versa.  Likewise has generalized weakness.  She apparently also relates a history of bright red blood per rectum and is scheduled for upper and lower endoscopy in the coming days.    Due to epigastric right upper quadrant postprandial symptoms, she did undergo gallbladder ultrasound which was normal and stimulated HIDA scan revealed ejection fraction of 31%.    Past medical history is significant for breast cancer.  Identifies history of GERD as concomitant with adjuvant chemotherapy.    Exam.  Alert.  Comfortable.  Abdomen at present entirely soft.    Imaging noted above.  HIDA scan 31% the patient does state that it did reproduce her symptoms.  Also has documented bowel reflux prior to obtaining CCK.  CT scan also obtained which shows no acute findings.    Assessment.  Married GI symptoms including some suggestive of biliary disease and others, diarrhea and blood not suggestive.  Difficulties of diagnosis with above radiographic findings especially in the setting of additional GI symptoms discussed at great length with patient.  Usual discussion regarding presumed acalculous biliary symptoms gone over in detail.    Patient does have fairly typical symptoms but again these are just part of her overall issue making attribution to the biliary tract somewhat difficult.  Additionally radiographic evidence of bile reflux further confuses the issue.  I recommended to the patient that she have her upper and lower endoscopy which is scheduled in the coming days.  In the interim if she has 1 of these  postprandial episodes have asked her to try a binding antiacid such as Mylanta or Tums to see if that may mitigate any symptoms that could be caused from bile reflux.  Additionally upper GI endoscopy may shed light on anatomic changes of bowel reflux.  She is to return for further discussion following tests and therapeutic trial                                    Erle Crocker, MD      NOTE: This report, in part or full,may have been transcribed using voice recognition software. Every effort was made to ensure accuracy; however, inadvertent computerized transcription errors may be present. Please excuse any transcriptional grammatical or spelling errors that may have escaped my editorial review.

## 2021-09-11 ENCOUNTER — Ambulatory Visit
Admit: 2021-09-11 | Discharge: 2021-09-11 | Payer: BLUE CROSS/BLUE SHIELD | Attending: Surgery | Primary: Family Medicine

## 2021-09-11 DIAGNOSIS — R1013 Epigastric pain: Secondary | ICD-10-CM

## 2021-09-11 DIAGNOSIS — K8020 Calculus of gallbladder without cholecystitis without obstruction: Secondary | ICD-10-CM

## 2021-09-11 NOTE — Progress Notes (Signed)
Pre Procedure Patient Instructions    Procedure Location hospital: [facility]: [address] [place] in front of Hospital Main Entrance and check in at [employer].    Procedure Date 09/13/21  Arrival Time  0600      Medications:  Medication to be taken the morning of surgery with a few sips of water only: PROTONIX, FLONASE NASAL SPRAY.  Stop all supplements, vitamins and herbal remedies one week prior to your procedure, unless your doctor told you to continue taking.  Do not take over the counter pain medications except plain Tylenol or Acetaminophen unless your doctor told you to do so.  If you are taking blood thinners, call the doctor performing your procedure and prescribing physician for instructions on when to stop.    Procedure Preparation    Diet Restrictions: No food or drink including gum or mints -after midnight    Skin Preparation:  Wash with Hibiclens or an antibacterial soap (e.g. Dial soap) the night before and morning of procedure.  Do not put on any deodorants, lotions, powders, or oils afterwards.  Be sure to put on clean clothes    Other Preparation:  Call your surgeon right away if you get any wounds, cuts, scrapes, scabs, rashes, bug bites at or near your operative site or if you have any fever, cold or flu symptoms.  No test required before surgery      Day of Procedure Patient Instruction:  Do not smoke, vape, chew tobacco, drink alcohol or use recreational drugs on the day of your procedure  Remove all jewelry, piercings, and metal accessories  Do not wear artificial nails and only clear nail polish on natural nails. Nails must be trimmed to fingertip length to make sure the oxygen probe fits properly and to avoid injury  Do not wear artificial eye lashes because they can cause dry eyes, eye scratches, eye infections and allergic reactions  Do not use hair extensions with metal clips or hairstyles near the back of your neck, as it can make it difficult to safely manage  your breathing during anesthesia  If your hair is tightly braided or styled in a complex way, it may need to be undone for your safety  Do not wear contacts, tampons, make-up, lotions, creams, powders, fragrances or deodorant  Do not bring valuables or money  Bring a copy of your Living Will and/or Medical Durable Power of Attorney if you have one  Bring a list of current medications including name and dosage  Bring a picture ID and insurance card and any of the following that are applicable to you     Storage case for eyeglasses, hearing aids, dentures, etc     A loose button-up shirt if instructed            If you are going home the same day as your procedure, a support person should accompany you to the facility and must transport you home.  If you plan to take public transportation of any sort, your support person must accompany you home.  You will need someone to stay with you for 24 hours after your procedure with sedation of any kind.         No COVID testing required as discussed.       Comments:     The information and visitor policy was reviewed with you during your Pre-Admission Testing interview and you verbalized understanding. If you have any additional questions please contact [phone]  For financial questions regarding your procedure at a [facility] facility, please contact [phone], option 1    For financial questions regarding anesthesia at a [facility] facility, please  contact [phone]

## 2021-09-11 NOTE — Progress Notes (Unsigned)
[facility] Note                HPI: Susan Hardy is a 46 y.o. female who presents following completion of her GI work-up with both upper and lower endoscopy.  Apparently had a polyp and a small hiatal hernia which was the only findings.  No explanation for upper abdominal pain or diarrhea.    Exam.  Alert.  Chest clear.  Heart regular rate and rhythm.  Abdomen soft and nontender.    Assessment.  Repeat discussion from last visit.  Fairly typical symptoms other than diarrhea and reproduction of symptoms with HIDA scan."  Her 80% chance of improvement in symptoms.  She desires to proceed.  Operation discussed as was expected recovery.  Schedule for Thursday.                                    Erle Crocker, MD      NOTE: This report, in part or full,may have been transcribed using voice recognition software. Every effort was made to ensure accuracy; however, inadvertent computerized transcription errors may be present. Please excuse any transcriptional grammatical or spelling errors that may have escaped my editorial review.

## 2021-09-13 ENCOUNTER — Inpatient Hospital Stay: Payer: BLUE CROSS/BLUE SHIELD | Attending: Surgery

## 2021-09-13 MED ORDER — PROPOFOL 1000 MG/100ML IV EMUL
1000 MG/100ML | INTRAVENOUS | Status: AC
Start: 2021-09-13 — End: ?

## 2021-09-13 MED ORDER — ONDANSETRON HCL 4 MG/2ML IJ SOLN
4 MG/2ML | INTRAMUSCULAR | Status: AC
Start: 2021-09-13 — End: ?

## 2021-09-13 MED ORDER — ONDANSETRON HCL 4 MG/2ML IJ SOLN
4 MG/2ML | INTRAMUSCULAR | Status: DC | PRN
Start: 2021-09-13 — End: 2021-09-13
  Administered 2021-09-13: 15:00:00 4 via INTRAVENOUS

## 2021-09-13 MED ORDER — NORMAL SALINE FLUSH 0.9 % IV SOLN
0.9 % | INTRAVENOUS | Status: DC | PRN
Start: 2021-09-13 — End: 2021-09-13

## 2021-09-13 MED ORDER — GLYCOPYRROLATE 1 MG/5ML IV SOSY
1 MG/5ML | INTRAVENOUS | Status: AC
Start: 2021-09-13 — End: ?

## 2021-09-13 MED ORDER — LIDOCAINE HCL 1 % IJ SOLN
1 % | INTRAMUSCULAR | Status: DC | PRN
Start: 2021-09-13 — End: 2021-09-13
  Administered 2021-09-13: 15:00:00 80 via INTRAVENOUS

## 2021-09-13 MED ORDER — BUPIVACAINE-EPINEPHRINE (PF) 0.5% -1:200000 IJ SOLN
INTRAMUSCULAR | Status: AC
Start: 2021-09-13 — End: ?

## 2021-09-13 MED ORDER — DEXAMETHASONE SODIUM PHOSPHATE 4 MG/ML IJ SOLN
4 MG/ML | INTRAMUSCULAR | Status: AC
Start: 2021-09-13 — End: ?

## 2021-09-13 MED ORDER — PROPOFOL 200 MG/20ML IV EMUL
200 MG/20ML | INTRAVENOUS | Status: DC | PRN
Start: 2021-09-13 — End: 2021-09-13
  Administered 2021-09-13: 15:00:00 200 via INTRAVENOUS

## 2021-09-13 MED ORDER — CEFAZOLIN SODIUM 2 G IJ SOLR
2 g | INTRAMUSCULAR | Status: AC
Start: 2021-09-13 — End: 2021-09-13

## 2021-09-13 MED ORDER — SCOPOLAMINE 1 MG/3DAYS TD PT72
1 MG/3DAYS | TRANSDERMAL | Status: DC
Start: 2021-09-13 — End: 2021-09-13
  Administered 2021-09-13: 14:00:00 1 via TRANSDERMAL

## 2021-09-13 MED ORDER — MIDAZOLAM HCL 2 MG/2ML IJ SOLN
2 MG/ML | INTRAMUSCULAR | Status: DC | PRN
Start: 2021-09-13 — End: 2021-09-13
  Administered 2021-09-13: 15:00:00 2 via INTRAVENOUS

## 2021-09-13 MED ORDER — PROPOFOL 200 MG/20ML IV EMUL
200 MG/20ML | INTRAVENOUS | Status: AC
Start: 2021-09-13 — End: ?

## 2021-09-13 MED ORDER — FENTANYL CITRATE (PF) 100 MCG/2ML IJ SOLN
100 MCG/2ML | INTRAMUSCULAR | Status: AC
Start: 2021-09-13 — End: ?

## 2021-09-13 MED ORDER — HYDROMORPHONE HCL 1 MG/ML IJ SOLN
1 MG/ML | INTRAMUSCULAR | Status: DC | PRN
Start: 2021-09-13 — End: 2021-09-13

## 2021-09-13 MED ORDER — ROCURONIUM BROMIDE 50 MG/5ML IV SOLN
50 MG/5ML | INTRAVENOUS | Status: AC
Start: 2021-09-13 — End: ?

## 2021-09-13 MED ORDER — PROPOFOL 1000 MG/100ML IV EMUL
1000 MG/100ML | INTRAVENOUS | Status: DC | PRN
Start: 2021-09-13 — End: 2021-09-13
  Administered 2021-09-13: 15:00:00 150 via INTRAVENOUS

## 2021-09-13 MED ORDER — BUPIVACAINE-EPINEPHRINE 0.5% -1:200000 IJ SOLN
INTRAMUSCULAR | Status: DC | PRN
Start: 2021-09-13 — End: 2021-09-13
  Administered 2021-09-13: 15:00:00 10 via INTRADERMAL

## 2021-09-13 MED ORDER — LIDOCAINE HCL 1 % IJ SOLN
1 % | INTRAMUSCULAR | Status: AC
Start: 2021-09-13 — End: ?

## 2021-09-13 MED ORDER — DEXAMETHASONE SODIUM PHOSPHATE 4 MG/ML IJ SOLN
4 MG/ML | INTRAMUSCULAR | Status: DC | PRN
Start: 2021-09-13 — End: 2021-09-13
  Administered 2021-09-13: 15:00:00 8 via INTRAVENOUS

## 2021-09-13 MED ORDER — SCOPOLAMINE 1 MG/3DAYS TD PT72
1 MG/3DAYS | TRANSDERMAL | Status: DC
Start: 2021-09-13 — End: 2021-09-13

## 2021-09-13 MED ORDER — GLYCOPYRROLATE 1 MG/5ML IV SOSY
1 MG/5ML | INTRAVENOUS | Status: DC | PRN
Start: 2021-09-13 — End: 2021-09-13
  Administered 2021-09-13: 15:00:00 .2 via INTRAVENOUS
  Administered 2021-09-13: 16:00:00 .6 via INTRAVENOUS

## 2021-09-13 MED ORDER — CEFAZOLIN SODIUM 1 G IJ SOLR
1 g | INTRAMUSCULAR | Status: DC | PRN
Start: 2021-09-13 — End: 2021-09-13
  Administered 2021-09-13: 15:00:00 2 via INTRAVENOUS

## 2021-09-13 MED ORDER — NEOSTIGMINE METHYLSULFATE 5 MG/5ML IV SOSY
5 MG/ML | INTRAVENOUS | Status: DC | PRN
Start: 2021-09-13 — End: 2021-09-13
  Administered 2021-09-13: 16:00:00 3 via INTRAVENOUS

## 2021-09-13 MED ORDER — FENTANYL CITRATE (PF) 100 MCG/2ML IJ SOLN
100 MCG/2ML | INTRAMUSCULAR | Status: DC | PRN
Start: 2021-09-13 — End: 2021-09-13
  Administered 2021-09-13 (×3): 25 ug via INTRAVENOUS

## 2021-09-13 MED ORDER — PROMETHAZINE HCL 25 MG/ML IJ SOLN
25 MG/ML | INTRAMUSCULAR | Status: DC | PRN
Start: 2021-09-13 — End: 2021-09-13
  Administered 2021-09-13 (×2): 6.25 via INTRAMUSCULAR

## 2021-09-13 MED ORDER — OXYCODONE HCL 5 MG PO TABS
5 MG | Freq: Once | ORAL | Status: AC | PRN
Start: 2021-09-13 — End: 2021-09-13
  Administered 2021-09-13: 17:00:00 5 mg via ORAL

## 2021-09-13 MED ORDER — KETOROLAC TROMETHAMINE 30 MG/ML IJ SOLN
30 MG/ML | INTRAMUSCULAR | Status: AC
Start: 2021-09-13 — End: ?

## 2021-09-13 MED ORDER — SODIUM CHLORIDE 0.9 % IV SOLN
0.9 % | INTRAVENOUS | Status: DC | PRN
Start: 2021-09-13 — End: 2021-09-13

## 2021-09-13 MED ORDER — NEOSTIGMINE METHYLSULFATE 5 MG/5ML IV SOSY
5 MG/ML | INTRAVENOUS | Status: AC
Start: 2021-09-13 — End: ?

## 2021-09-13 MED ORDER — ONDANSETRON HCL 4 MG/2ML IJ SOLN
4 MG/2ML | Freq: Once | INTRAMUSCULAR | Status: DC | PRN
Start: 2021-09-13 — End: 2021-09-13

## 2021-09-13 MED ORDER — SODIUM CHLORIDE 0.9 % IV SOLN (MINI-BAG)
0.9 % | INTRAVENOUS | Status: DC
Start: 2021-09-13 — End: 2021-09-13

## 2021-09-13 MED ORDER — FENTANYL CITRATE (PF) 100 MCG/2ML IJ SOLN
100 MCG/2ML | INTRAMUSCULAR | Status: DC | PRN
Start: 2021-09-13 — End: 2021-09-13
  Administered 2021-09-13: 15:00:00 100 via INTRAVENOUS

## 2021-09-13 MED ORDER — KETOROLAC TROMETHAMINE 30 MG/ML IJ SOLN
30 MG/ML | INTRAMUSCULAR | Status: DC | PRN
Start: 2021-09-13 — End: 2021-09-13
  Administered 2021-09-13: 16:00:00 30 via INTRAVENOUS

## 2021-09-13 MED ORDER — PROMETHAZINE HCL 25 MG/ML IJ SOLN
25 MG/ML | INTRAMUSCULAR | Status: AC
Start: 2021-09-13 — End: ?

## 2021-09-13 MED ORDER — MIDAZOLAM HCL 2 MG/2ML IJ SOLN
2 MG/ML | INTRAMUSCULAR | Status: AC
Start: 2021-09-13 — End: ?

## 2021-09-13 MED ORDER — LACTATED RINGERS IV SOLN
INTRAVENOUS | Status: DC | PRN
Start: 2021-09-13 — End: 2021-09-13
  Administered 2021-09-13: 15:00:00 via INTRAVENOUS

## 2021-09-13 MED ORDER — OXYCODONE HCL 5 MG PO TABS
5 MG | ORAL_TABLET | Freq: Four times a day (QID) | ORAL | 0 refills | Status: AC | PRN
Start: 2021-09-13 — End: 2021-09-16

## 2021-09-13 MED ORDER — NORMAL SALINE FLUSH 0.9 % IV SOLN
0.9 % | Freq: Two times a day (BID) | INTRAVENOUS | Status: DC
Start: 2021-09-13 — End: 2021-09-13

## 2021-09-13 MED ORDER — ROCURONIUM BROMIDE 50 MG/5ML IV SOLN
50 MG/5ML | INTRAVENOUS | Status: DC | PRN
Start: 2021-09-13 — End: 2021-09-13
  Administered 2021-09-13: 15:00:00 40 via INTRAVENOUS

## 2021-09-13 MED FILL — DEXAMETHASONE SODIUM PHOSPHATE 4 MG/ML IJ SOLN: 4 MG/ML | INTRAMUSCULAR | Qty: 1

## 2021-09-13 MED FILL — BD POSIFLUSH 0.9 % IV SOLN: 0.9 % | INTRAVENOUS | Qty: 40

## 2021-09-13 MED FILL — MARCAINE/EPINEPHRINE PF 0.5% -1:200000 IJ SOLN: INTRAMUSCULAR | Qty: 20

## 2021-09-13 MED FILL — OXYCODONE HCL 5 MG PO TABS: 5 MG | ORAL | Qty: 1

## 2021-09-13 MED FILL — PROMETHAZINE HCL 25 MG/ML IJ SOLN: 25 MG/ML | INTRAMUSCULAR | Qty: 1

## 2021-09-13 MED FILL — FENTANYL CITRATE (PF) 100 MCG/2ML IJ SOLN: 100 MCG/2ML | INTRAMUSCULAR | Qty: 2

## 2021-09-13 MED FILL — ROCURONIUM BROMIDE 50 MG/5ML IV SOLN: 50 MG/5ML | INTRAVENOUS | Qty: 5

## 2021-09-13 MED FILL — MIDAZOLAM HCL 2 MG/2ML IJ SOLN: 2 MG/ML | INTRAMUSCULAR | Qty: 2

## 2021-09-13 MED FILL — DIPRIVAN 200 MG/20ML IV EMUL: 200 MG/20ML | INTRAVENOUS | Qty: 20

## 2021-09-13 MED FILL — NEOSTIGMINE METHYLSULFATE 5 MG/5ML IV SOSY: 5 MG/ML | INTRAVENOUS | Qty: 5

## 2021-09-13 MED FILL — PROPOFOL 1000 MG/100ML IV EMUL: 1000 MG/100ML | INTRAVENOUS | Qty: 100

## 2021-09-13 MED FILL — SODIUM CHLORIDE 0.9 % IV SOLN: 0.9 % | INTRAVENOUS | Qty: 100

## 2021-09-13 MED FILL — GLYCOPYRROLATE 1 MG/5ML IV SOSY: 1 MG/5ML | INTRAVENOUS | Qty: 5

## 2021-09-13 MED FILL — ONDANSETRON HCL 4 MG/2ML IJ SOLN: 4 MG/2ML | INTRAMUSCULAR | Qty: 2

## 2021-09-13 MED FILL — KETOROLAC TROMETHAMINE 30 MG/ML IJ SOLN: 30 MG/ML | INTRAMUSCULAR | Qty: 1

## 2021-09-13 MED FILL — CEFAZOLIN SODIUM 2 G IJ SOLR: 2 g | INTRAMUSCULAR | Qty: 2000

## 2021-09-13 MED FILL — TRANSDERM-SCOP 1 MG/3DAYS TD PT72: 1 MG/3DAYS | TRANSDERMAL | Qty: 1

## 2021-09-13 MED FILL — XYLOCAINE 1 % IJ SOLN: 1 % | INTRAMUSCULAR | Qty: 20

## 2021-09-13 NOTE — Interval H&P Note (Signed)
Update History & Physical    The patient's History and Physical of August 1,  was reviewed with the patient and I examined the patient.  Since the above visit the patient has completed a GI work-up with negative upper and lower endoscopic findings.  Symptoms persist.  She strongly desires to proceed.  Surgical site was confirmed by the patient and me.     Plan: The risks, benefits, expected outcome, and alternative to the recommended procedure have been discussed with the patient. Patient understands and wants to proceed with the procedure.     Electronically signed by Erle Crocker, MD on 09/13/2021 at 11:01 AM

## 2021-09-13 NOTE — Anesthesia Procedure Notes (Signed)
Airway  Date/Time: 09/13/2021 11:20 AM  Urgency: elective      General Information and Staff    Patient location during procedure: OR    Indications and Patient Condition  Indications for airway management: anesthesia  Patient position: sniffing  Mask difficulty assessment: vent by bag mask    Final Airway Details  Final airway type: endotracheal airway      Successful airway: ETT     Successful intubation technique: direct laryngoscopy  Facilitating devices/methods: intubating stylet  Endotracheal tube insertion site: oral  Blade: Macintosh  Blade size: #3  ETT size (mm): 7.0  Cormack-Lehane Classification: grade IIa - partial view of glottis  Placement verified by: chest auscultation and capnometry   Measured from: teeth  ETT to teeth (cm): 21    Additional Comments  Oropharynx and dentition same as preop

## 2021-09-13 NOTE — Op Note (Signed)
Operative Note      Patient: Susan Hardy  Date of Birth: 1975-12-14  MRN: [id number]    Date of Procedure: 09/13/2021    Pre-Op diagnosis: Biliary dyskinesia    Post-Op Diagnosis:  Cholelithiasis with chronic cholecystitis       Procedure(s):  CHOLECYSTECTOMY LAPAROSCOPIC    Surgeon(s):  Erle Crocker, MD    Assistant:   * No surgical staff found *    Anesthesia: General    Estimated Blood Loss (mL): Minimal    Complications: None    Specimens:   ID Type Source Tests Collected by Time Destination   A : GALLBLADDER Tissue Tissue SURGICAL PATHOLOGY Erle Crocker, MD 09/13/2021 1137        Implants:  * No implants in log *      Drains: * No LDAs found *    Findings: Several small gallstones.  Distended gallbladder            Detailed Description of Procedure:   Procedure.  Patient was taken to the operating room and following the induction of general endotracheal anesthesia was prepped and draped in normal sterile fashion.    Small supraumbilical incision was made and dissection carried to the peritoneum which was entered under direct vision with the nurse on.  This was secured using 0 Vicryl sutures.  CO2 pneumoperitoneum was obtained.  Three 5 mm trochars were placed in the standard upper abdominal location.    The gallbladder was grasped and reflected upward with the cystic area being dissected and the cystic duct and artery definitively identified clipped and divided.  The gallbladder was then dissected free from the gallbladder bed without incident.  Specimen was removed and submitted for permanent section.    Gallbladder bed was inspected when hemostatic irrigated and aspirated spent fluid.  The umbilicus was inspected intra-abdominally because of some bruising on entry.  None was identified but the site was cauterized trochars were removed under direct vision and CO2 pneumoperitoneum was relieved.  The umbilical incision was closed using 0 Vicryl skin was closed at all layers using 4-0 Monocryl.    Patient was  extubated in the operating room transferred recovery room in stable condition with needle lap counts being correct    Electronically signed by Erle Crocker, MD on 09/13/2021 at 11:48 AM

## 2021-09-13 NOTE — Discharge Instructions (Addendum)
Ad lib. activity.  Diet of choice.  May shower starting tomorrow.  To office in 2 weeks.

## 2021-09-13 NOTE — Anesthesia Pre-Procedure Evaluation (Signed)
Department of Anesthesiology  Preprocedure Note       Name:  Susan Hardy   Age:  46 y.o.  DOB:  04/15/1975                                          MRN:  [id number]         Date:  09/13/2021      Surgeon: Moishe Spice):  Erle Crocker, MD    Procedure: Procedure(s):  CHOLECYSTECTOMY LAPAROSCOPIC    Medications prior to admission:   Prior to Admission medications    Medication Sig Start Date End Date Taking? Authorizing Provider   Multiple Vitamins-Minerals (THERAPEUTIC MULTIVITAMIN-MINERALS) tablet Take 1 tablet by mouth daily   Yes Historical Provider, MD   pantoprazole (PROTONIX) 40 MG tablet Take 1 tablet orally twice day  Patient taking differently: Take 1 tablet by mouth in the morning and at bedtime Take 1 tablet orally twice day 08/09/21   Gabriel Cirri, MD   fluticasone Aleda Grana) 50 MCG/ACT nasal spray 1 spray by Nasal route every morning 07/25/03   Historical Provider, MD   citalopram (CELEXA) 20 MG tablet Take 1 tablet by mouth daily  Patient taking differently: Take 1 tablet by mouth at bedtime 08/03/21   Gabriel Cirri, MD   traZODone (DESYREL) 100 MG tablet Take 1 tablet orally once a day  Patient taking differently: Take 1.5 tablets by mouth nightly 08/03/21   Gabriel Cirri, MD   loratadine (CLARITIN) 10 MG tablet Take 1 tablet by mouth at bedtime Take 1 tablet orally once a day    Historical Provider, MD       Current medications:    Current Facility-Administered Medications   Medication Dose Route Frequency Provider Last Rate Last Admin   . sodium chloride 0.9 % infusion            . ceFAZolin (ANCEF) 2 g injection            . bupivacaine-EPINEPHrine (MARCAINE-w/EPINEPHRINE) 0.5% -1:200000 injection    PRN Erle Crocker, MD   10 mL at 09/13/21 0000       Allergies:    Allergies   Allergen Reactions   . Lavender Oil Hives       Problem List:    Patient Active Problem List   Diagnosis Code   . Cholelithiasis K80.20       Past Medical History:        Diagnosis Date   . Abdominal pain    . Anxiety 06/2006    . Cancer (HCC) 10/21/2008    LEFT Breast   . Depression 06/2006   . GERD (gastroesophageal reflux disease) 07/09/2021    AND BILE REFLUX, LOOSE STOOLS   . Hyperlipidemia    . Limb alert care status     NO BP OR IV TO LEFT ARM   . Lung nodule    . Nausea after anesthesia    . Neuropathy 01/2009    FEET   . Restless legs syndrome 06/2009       Past Surgical History:        Procedure Laterality Date   . ENDOSCOPY, COLON, DIAGNOSTIC  08/29/2021   . HYSTERECTOMY, TOTAL ABDOMINAL (CERVIX REMOVED)  11/2008    AND BSO   . MASTECTOMY, BILATERAL  11/2008   . OTHER SURGICAL HISTORY  2010    PORT INSERTION AND  REMOVAL AFTER TREATMENT COMPLETION   . WISDOM TOOTH EXTRACTION  1998       Social History:    Social History     Tobacco Use   . Smoking status: Never   . Smokeless tobacco: Never   Substance Use Topics   . Alcohol use: Yes     Comment: 1-2 DRINKS/MONTH                                Counseling given: Not Answered      Vital Signs (Current):   Vitals:    09/11/21 1652 09/13/21 0910   BP:  (!) 127/91   Pulse:  99   Resp:  18   Temp:  98 F (36.7 C)   TempSrc:  Oral   SpO2:  95%   Weight: 205 lb (93 kg) 205 lb 7.5 oz (93.2 kg)   Height: 5\' 10"  (1.778 m) 5\' 10"  (1.778 m)                                              BP Readings from Last 3 Encounters:   09/13/21 (!) 127/91   08/20/21 114/78   08/03/21 114/80       NPO Status: Time of last liquid consumption: 0730                        Time of last solid consumption: 1700                        Date of last liquid consumption: 09/13/21                        Date of last solid food consumption: 09/12/21    BMI:   Wt Readings from Last 3 Encounters:   09/13/21 205 lb 7.5 oz (93.2 kg)   08/20/21 209 lb (94.8 kg)   08/03/21 211 lb 3.2 oz (95.8 kg)     Body mass index is 29.48 kg/m.    CBC:   Lab Results   Component Value Date/Time    WBC 7.8 08/20/2021 04:05 PM    RBC 4.90 08/20/2021 04:05 PM    HGB 14.4 08/20/2021 04:05 PM    HCT 44.6 08/20/2021 04:05 PM    MCV 91 08/20/2021  04:05 PM    RDW 12.7 08/20/2021 04:05 PM    PLT 237 08/20/2021 04:05 PM       CMP:   Lab Results   Component Value Date/Time    NA 138.0 08/20/2021 04:05 PM    K 3.49 08/20/2021 04:05 PM    CL 101.3 08/20/2021 04:05 PM    CO2 25.8 08/20/2021 04:05 PM    BUN 11.6 08/20/2021 04:05 PM    CREATININE 1.02 08/20/2021 04:05 PM    LABGLOM 58.84 08/20/2021 04:05 PM    GLUCOSE 107.0 08/20/2021 04:05 PM    PROT 7.00 08/20/2021 04:05 PM    CALCIUM 9.99 08/20/2021 04:05 PM    BILITOT 0.42 08/20/2021 04:05 PM    ALKPHOS 55 08/20/2021 04:05 PM    AST 14 08/20/2021 04:05 PM    ALT 17 08/20/2021 04:05 PM       POC Tests: No results for input(s): POCGLU, POCNA, POCK, POCCL, POCBUN,  POCHEMO, POCHCT in the last 72 hours.    Coags: No results found for: PROTIME, INR, APTT    HCG (If Applicable): No results found for: PREGTESTUR, PREGSERUM, HCG, HCGQUANT     ABGs: No results found for: PHART, PO2ART, PCO2ART, HCO3ART, BEART, O2SATART     Type & Screen (If Applicable):  No results found for: LABABO, LABRH    Drug/Infectious Status (If Applicable):  No results found for: HIV, HEPCAB    COVID-19 Screening (If Applicable): No results found for: COVID19        Anesthesia Evaluation  Patient summary reviewed and Nursing notes reviewed   history of anesthetic complications: PONV.  Airway: Mallampati: II  TM distance: >3 FB   Neck ROM: full  Mouth opening: > = 3 FB   Dental: normal exam         Pulmonary:normal exam                               Cardiovascular:Negative CV ROS                      Neuro/Psych:   (+) depression/anxiety             GI/Hepatic/Renal:   (+) GERD: well controlled,           Endo/Other:                      ROS comment: Breast cancer s/p chemo Abdominal:             Vascular:          Other Findings:           Anesthesia Plan      general     ASA 3       Induction: intravenous.      Anesthetic plan and risks discussed with patient.      Plan discussed with CRNA.                    Tyler Pita, MD    09/13/2021

## 2021-09-13 NOTE — Anesthesia Post-Procedure Evaluation (Signed)
Department of Anesthesiology  Postprocedure Note    Patient: Susan Hardy  MRN: [id number]  Birthdate: March 08, 1975  Date of evaluation: 09/13/2021      Procedure Summary     Date: 09/13/21 Room / Location: [facility] OR 03 / [facility] OR    Anesthesia Start: 1104 Anesthesia Stop: 1200    Procedure: CHOLECYSTECTOMY LAPAROSCOPIC (Abdomen) Diagnosis:       Cholelithiasis      (Cholelithiasis [K80.20])    Surgeons: Erle Crocker, MD Responsible Provider: Tyler Pita, MD    Anesthesia Type: General, TIVA ASA Status: 3          Anesthesia Type: General, TIVA    Aldrete Phase I: Aldrete Score: 9    Aldrete Phase II:        Anesthesia Post Evaluation    Patient location during evaluation: PACU  Patient participation: complete - patient participated  Level of consciousness: awake and alert  Airway patency: patent  Nausea & Vomiting: no nausea and no vomiting  Complications: no  Cardiovascular status: blood pressure returned to baseline  Respiratory status: acceptable  Hydration status: euvolemic  Comments: Vitals Reviewed  Pain management: adequate

## 2021-09-14 ENCOUNTER — Encounter: Payer: BLUE CROSS/BLUE SHIELD | Attending: Family Medicine | Primary: Family Medicine

## 2021-09-17 ENCOUNTER — Encounter: Attending: Hematology & Oncology | Primary: Family Medicine

## 2021-09-17 ENCOUNTER — Encounter: Primary: Family Medicine

## 2021-10-02 ENCOUNTER — Ambulatory Visit
Admit: 2021-10-02 | Discharge: 2021-10-02 | Payer: BLUE CROSS/BLUE SHIELD | Attending: Surgery | Primary: Family Medicine

## 2021-10-02 DIAGNOSIS — K8020 Calculus of gallbladder without cholecystitis without obstruction: Secondary | ICD-10-CM

## 2021-10-04 NOTE — Progress Notes (Signed)
[facility] Note                HPI: Susan Hardy is a 46 y.o. female who presents post op laparoscopic cholecystectomy.  Feeling well and minimal issues.    Abdomen soft.  All wounds healed.    Examination of gallbladder at time of cholecystectomy did show very tiny stones.  She has had complete relief of symptoms.    Assessment.  Recovering as expected from cholecystectomy.  Ad lib. activity and diet allowed.  Return here as needed for continued symptoms.                                    Erle Crocker, MD      NOTE: This report, in part or full,may have been transcribed using voice recognition software. Every effort was made to ensure accuracy; however, inadvertent computerized transcription errors may be present. Please excuse any transcriptional grammatical or spelling errors that may have escaped my editorial review.

## 2021-10-05 ENCOUNTER — Encounter: Payer: BLUE CROSS/BLUE SHIELD | Attending: Family Medicine | Primary: Family Medicine

## 2021-10-08 ENCOUNTER — Encounter: Payer: BLUE CROSS/BLUE SHIELD | Attending: Family Medicine | Primary: Family Medicine

## 2021-10-18 NOTE — Telephone Encounter (Signed)
Returned call to patient.  Advised to go to the ER but stated that they are already on the way to the ER at the [facility] them aware to make sure they let the ER know that she is Dr. Melina Modena patient so they can contact him if needed.  He is at  [facility] all day in surgery and office hours.

## 2021-10-18 NOTE — Telephone Encounter (Signed)
From: Charlynne Pander  To: Dr. Beatrix Fetters  Sent: 10/18/2021 8:20 AM EDT  Subject: Rich Fuchs Morning I tried to call the office but the after hours is still on. Dr Melina Modena did my gall bladder surgery and he's scheduled to do my wife's on the 12th. She is doubled over in pain. Out of curiosity he wouldn't be able to do it today would he. Susan Hardy has been having diarrhea and nausea since midnight last night and is doubled over in pain. [phone]. If someone could call me I'd appreciate it. I keep saying we should go to the ER but she really wants Dr Melina Modena to do the surgery. Thank you.

## 2021-11-19 ENCOUNTER — Ambulatory Visit: Payer: BC Managed Care – PPO | Admitting: Oncology

## 2021-11-19 ENCOUNTER — Other Ambulatory Visit: Payer: BC Managed Care – PPO

## 2021-11-27 ENCOUNTER — Ambulatory Visit
Admit: 2021-11-27 | Discharge: 2021-11-27 | Payer: BLUE CROSS/BLUE SHIELD | Attending: Family Medicine | Primary: Family Medicine

## 2021-11-27 DIAGNOSIS — K219 Gastro-esophageal reflux disease without esophagitis: Secondary | ICD-10-CM

## 2021-11-27 MED ORDER — LORAZEPAM 1 MG PO TABS
1 MG | ORAL_TABLET | Freq: Every day | ORAL | 0 refills | Status: AC | PRN
Start: 2021-11-27 — End: 2021-12-27

## 2021-11-27 MED ORDER — PANTOPRAZOLE SODIUM 40 MG PO TBEC
40 MG | ORAL_TABLET | Freq: Every day | ORAL | 1 refills | Status: DC
Start: 2021-11-27 — End: 2022-05-26

## 2021-11-27 MED ORDER — ROPINIROLE HCL 0.25 MG PO TABS
0.25 MG | ORAL_TABLET | ORAL | 2 refills | Status: DC
Start: 2021-11-27 — End: 2022-05-28

## 2021-11-27 NOTE — Progress Notes (Signed)
Chief Complaint:     Follow-up and restless legs          ASSESSMENT/PLAN:    ICD-10-CM    1. Gastroesophageal reflux disease, unspecified whether esophagitis present Controlled K21.9       2. Insomnia, unspecified type Stable G47.00       3. Anxiety and depression Stable F41.9 LORazepam (ATIVAN) 1 MG tablet    F32.A       4. RLS (restless legs syndrome) Inadequately Controlled G25.81         Patient doing well. Blood pressure  in controlled. Mood is stable. Will rx prn lorazepam. Insomnia is stable.  Continue current medications.     Discussed trial of requip for RLS. Discussed may need to titrate dose.     GERD is controlled-- discussed decreasing pantoprazole back to once daily.     Encouraged healthy eating and physical activity efforts.     Keep specialist appointments as planned.       Return in about 6 months (around 05/28/2022).         Subjective   SUBJECTIVE/OBJECTIVE:  Patient with past medical history of breast cancer (BRCA1+), GERD, insomnia, depression/anxiety, insomnia here for routine follow-up.     Notes she has been feeling well since cholecystectomy.     Requesting rx for prn lorazepam. Notes 30 tabs have lasted her a year.     Also restless legs have not been controlled. She has been on mirapex and gabapentin. Would like to try something else.         ROS as per HPI or otherwise negative.           Objective   Vitals:    11/27/21 1322   BP: 122/78   Pulse: 85   Resp: 18   SpO2: 97%       GENERAL: The patient is in no apparent distress. Alert and oriented. Vital Signs Reviewed HEENT: Head is normocephalic and atraumatic. Extraocular muscles are intact. Pupils are equal, Conjunctiva normal. Moist Mucous membranes. Posterior pharynx clear of any exudate or lesions. NECK: Supple. No Lymphadenopathy LUNGS: Clear to auscultation bilaterally. Breath Sounds equal. HEART: Regular rate and rhythm without murmur. No edema ABDOMEN: Soft, nontender, and nondistended. . Musculoskeletal: No deformity. Gait WNL.  No swelling noted. NEUROLOGIC: Alert and Oriented. No focal deficits. PSYCHIATRIC: Cooperative, mood appropriate. SKIN: No rash noted.                 An electronic signature was used to authenticate this note.    --Earlene Plater, MD

## 2022-01-28 ENCOUNTER — Encounter

## 2022-01-29 MED ORDER — CITALOPRAM HYDROBROMIDE 20 MG PO TABS
20 MG | ORAL_TABLET | Freq: Every day | ORAL | 1 refills | Status: DC
Start: 2022-01-29 — End: 2022-08-05

## 2022-01-29 MED ORDER — TRAZODONE HCL 100 MG PO TABS
100 MG | ORAL_TABLET | Freq: Every evening | ORAL | 1 refills | Status: DC
Start: 2022-01-29 — End: 2022-08-05

## 2022-03-06 NOTE — Telephone Encounter (Signed)
From: Charlynne Pander  To: Dr. Earlene Plater  Sent: 03/06/2022 9:53 AM EST  Subject: RLS Med    Hi Dr Juanita Craver,    The medication I started for the RLS just doesn't seem to be doing the trick, you said at my appointment to let you know. I was wondering if I could go back to gabapentim? That worked best for me previously. Or I am open to suggestions from you. Thanks so much. Effie Berkshire

## 2022-03-07 MED ORDER — GABAPENTIN 300 MG PO CAPS
300 MG | ORAL_CAPSULE | Freq: Every evening | ORAL | 5 refills | Status: AC
Start: 2022-03-07 — End: 2022-09-03

## 2022-05-26 MED ORDER — PANTOPRAZOLE SODIUM 40 MG PO TBEC
40 MG | ORAL_TABLET | Freq: Every day | ORAL | 1 refills | Status: DC
Start: 2022-05-26 — End: 2022-11-01

## 2022-05-28 ENCOUNTER — Ambulatory Visit
Admit: 2022-05-28 | Discharge: 2022-05-28 | Payer: BLUE CROSS/BLUE SHIELD | Attending: Family Medicine | Primary: Family Medicine

## 2022-05-28 DIAGNOSIS — F419 Anxiety disorder, unspecified: Secondary | ICD-10-CM

## 2022-05-28 NOTE — Progress Notes (Signed)
Chief Complaint:     6 Month Follow-Up  Chronic medical follow-up.     Assessment & Plan   ASSESSMENT/PLAN:    ICD-10-CM    1. Anxiety and depression Stable F41.9 CBC with Auto Differential    F32.A Comprehensive Metabolic Panel     TSH with Reflex     TSH with Reflex     Comprehensive Metabolic Panel     CBC with Auto Differential     Routine Venipuncture (16109)      2. Gastroesophageal reflux disease, unspecified whether esophagitis present Stable K21.9 CBC with Auto Differential     Comprehensive Metabolic Panel     TSH with Reflex     TSH with Reflex     Comprehensive Metabolic Panel     CBC with Auto Differential     Routine Venipuncture (60454)      3. Insomnia, unspecified type Stable G47.00 Routine Venipuncture (09811)      4. Hyperlipidemia, unspecified hyperlipidemia type Stable E78.5 CBC with Auto Differential     Comprehensive Metabolic Panel     Lipid Panel     Lipid Panel     Comprehensive Metabolic Panel     CBC with Auto Differential     Routine Venipuncture (91478)      5. RLS (restless legs syndrome) Inadequately Controlled G25.81 External Referral To Neurology        Patient doing well. Blood pressure  controlled. Check routine  labs. Continue current medications. Encouraged healthy eating and physical activity efforts. Keep specialist appointments as planned.     Discussed referral to neurology for other options for RLS.         Return in about 6 months (around 11/27/2022).         Subjective   SUBJECTIVE/OBJECTIVE:    Patient with past medical history of breast cancer (BRCA1+), GERD, insomnia, depression/anxiety, insomnia here for routine follow-up.      Notes she has been feeling well since  last visit.      Taking rare prn lorazepam.       RLS- taking requip.      Seeing dermatology for routine skin checks.     Seeing GI. Repeat colonoscopy in 2028.     ROS as per HPI or otherwise negative.           Objective   Vitals:    05/28/22 1449   BP: 126/76   Pulse: 71   Resp: 18   SpO2: 97%        GENERAL: The patient is in no apparent distress. Alert and oriented. Vital Signs Reviewed HEENT: Head is normocephalic and atraumatic. Extraocular muscles are intact. Pupils are equal, Conjunctiva normal. Moist Mucous membranes. Posterior pharynx clear of any exudate or lesions. NECK: Supple. No Lymphadenopathy LUNGS: Clear to auscultation bilaterally. Breath Sounds equal. HEART: Regular rate and rhythm without murmur. No edema ABDOMEN: Soft, nontender, and nondistended. . Musculoskeletal: No deformity. Gait WNL. No swelling noted. NEUROLOGIC: Alert and Oriented. No focal deficits. PSYCHIATRIC: Cooperative, mood appropriate. SKIN: No rash noted.                 An electronic signature was used to authenticate this note.    --Gabriel Cirri, MD

## 2022-05-29 LAB — CBC WITH AUTO DIFFERENTIAL
Basophils %: 0.8 % (ref 0.0–2.0)
Basophils Absolute: 0.1 10*3/uL (ref 0.0–0.2)
Eosinophils %: 1.7 % (ref 0.0–7.0)
Eosinophils Absolute: 0.1 10*3/uL (ref 0.0–0.5)
Hematocrit: 44.1 % (ref 34.0–47.0)
Hemoglobin: 14.8 g/dL (ref 11.5–15.7)
Immature Grans (Abs): 0.03 10*3/uL (ref 0.00–0.06)
Immature Granulocytes %: 0.4 % (ref 0.0–0.6)
Lymphocytes Absolute: 2.2 10*3/uL (ref 1.0–3.2)
Lymphocytes: 30.5 % (ref 15.0–45.0)
MCH: 29.1 pg (ref 27.0–34.5)
MCHC: 33.6 g/dL (ref 32.0–36.0)
MCV: 86.8 fL (ref 81.0–99.0)
MPV: 10.7 fL (ref 7.2–13.2)
Monocytes %: 6.3 % (ref 4.0–12.0)
Monocytes Absolute: 0.5 10*3/uL (ref 0.3–1.0)
NRBC Absolute: 0 10*3/uL (ref 0.000–0.012)
NRBC Automated: 0 % (ref 0.0–0.2)
Neutrophils %: 60.3 % (ref 42.0–74.0)
Neutrophils Absolute: 4.3 10*3/uL (ref 1.6–7.3)
Platelets: 218 10*3/uL (ref 140–440)
RBC: 5.08 x10e6/mcL (ref 3.60–5.20)
RDW: 12.6 % (ref 11.0–16.0)
WBC: 7.1 10*3/uL (ref 3.8–10.6)

## 2022-05-29 LAB — COMPREHENSIVE METABOLIC PANEL
ALT: 22 U/L (ref 0–35)
AST: 41 U/L — ABNORMAL HIGH (ref 0–35)
Albumin/Globulin Ratio: 1.9 (ref 1.00–2.70)
Albumin: 4.6 g/dL (ref 3.5–5.2)
Alk Phosphatase: 71 U/L (ref 35–117)
Anion Gap: 13 mmol/L (ref 2–17)
BUN: 13 mg/dL (ref 6–20)
CO2: 25 mmol/L (ref 22–29)
Calcium: 9.8 mg/dL (ref 8.5–10.7)
Chloride: 102 mmol/L (ref 98–107)
Creatinine: 1 mg/dL (ref 0.5–1.0)
Est, Glom Filt Rate: 71 mL/min/1.73m (ref >=60)
Globulin: 2.4 g/dL (ref 1.9–4.4)
Glucose: 96 mg/dL (ref 70–99)
Osmolaliy Calculated: 279 mOsm/kg (ref 270–287)
Potassium: 5.3 mmol/L (ref 3.5–5.3)
Sodium: 140 mmol/L (ref 135–145)
Total Bilirubin: 0.41 mg/dL (ref 0.00–1.20)
Total Protein: 7 g/dL (ref 5.7–8.3)

## 2022-05-29 LAB — LIPID PANEL
Chol/HDL Ratio: 3.6 (ref 0.0–4.4)
Cholesterol, Total: 200 mg/dL (ref 100–200)
HDL: 55 mg/dL (ref >=50)
LDL Cholesterol: 117.2 mg/dL — ABNORMAL HIGH (ref 0.0–100.0)
LDL/HDL Ratio: 2.1
Triglycerides: 139 mg/dL (ref 0–149)
VLDL: 27.8 mg/dL (ref 5.0–40.0)

## 2022-05-29 LAB — TSH WITH REFLEX: TSH: 2.75 mcIU/mL (ref 0.358–3.740)

## 2022-08-03 ENCOUNTER — Encounter

## 2022-08-05 MED ORDER — TRAZODONE HCL 100 MG PO TABS
100 | ORAL_TABLET | Freq: Every evening | ORAL | 1 refills | Status: AC
Start: 2022-08-05 — End: ?

## 2022-08-05 MED ORDER — CITALOPRAM HYDROBROMIDE 20 MG PO TABS
20 | ORAL_TABLET | Freq: Every day | ORAL | 1 refills | Status: AC
Start: 2022-08-05 — End: ?

## 2022-08-26 ENCOUNTER — Ambulatory Visit
Admit: 2022-08-26 | Discharge: 2022-08-26 | Payer: BLUE CROSS/BLUE SHIELD | Attending: Physician Assistant | Primary: Family Medicine

## 2022-08-26 ENCOUNTER — Inpatient Hospital Stay: Admit: 2022-08-26 | Payer: BLUE CROSS/BLUE SHIELD | Primary: Family Medicine

## 2022-08-26 ENCOUNTER — Other Ambulatory Visit: Admit: 2022-08-26 | Discharge: 2022-08-26 | Payer: BLUE CROSS/BLUE SHIELD | Primary: Family Medicine

## 2022-08-26 DIAGNOSIS — Z853 Personal history of malignant neoplasm of breast: Secondary | ICD-10-CM

## 2022-08-26 LAB — CBC WITH AUTO DIFFERENTIAL
Basophils %: 0.9 % (ref 0.0–2.0)
Basophils Absolute: 0.1 10*3/uL (ref 0.0–0.2)
Eosinophils %: 2.1 % (ref 0.0–7.0)
Eosinophils Absolute: 0.2 10*3/uL (ref 0.0–0.5)
Hematocrit: 42.7 % (ref 34.0–47.0)
Hemoglobin: 14 g/dL (ref 11.5–15.7)
Lymphocytes Absolute: 2.1 10*3/uL (ref 1.0–3.2)
Lymphocytes: 27.7 % (ref 15.0–45.0)
MCH: 28.9 pg (ref 27.0–34.5)
MCHC: 32.8 g/dL (ref 30.0–36.0)
MCV: 88 fL (ref 81.0–99.0)
MPV: 10.4 fL (ref 7.0–12.2)
Monocytes %: 6.6 10*3/uL (ref 4.0–12.0)
Monocytes Absolute: 0.5 10*3/uL (ref 0.3–1.0)
Neutrophils %: 62.7 % (ref 42.0–74.0)
Neutrophils Absolute: 4.7 10*3/uL (ref 1.6–7.3)
Platelets: 211 10*3/uL (ref 140–440)
RBC: 4.85 10*3/uL (ref 3.60–5.20)
RDW: 12.9 % (ref 10.0–17.0)
WBC: 7.4 10*3/uL (ref 3.8–10.6)

## 2022-08-26 LAB — BASIC METABOLIC PANEL
Anion Gap: 6 mmol/L (ref 2–17)
BUN: 11 mg/dL (ref 6–20)
CO2: 33 mmol/L — ABNORMAL HIGH (ref 22–29)
Calcium: 9.5 mg/dL (ref 8.6–10.0)
Chloride: 100 mmol/L (ref 98–107)
Creatinine: 1.1 mg/dL — ABNORMAL HIGH (ref 0.5–1.0)
Est, Glom Filt Rate: 63 mL/min/1.73m (ref >=60)
Glucose: 97 mg/dL (ref 70–99)
Potassium: 3.9 mmol/L (ref 3.5–5.3)
Sodium: 139 mmol/L (ref 135–145)

## 2022-08-26 LAB — HEPATIC FUNCTION PANEL
ALT: 11 U/L (ref 0–35)
AST: 18 U/L (ref 0–35)
Albumin: 4.8 g/dL (ref 3.5–5.2)
Alk Phosphatase: 56 U/L (ref 40–130)
Total Bilirubin: 0.57 mg/dL (ref 0.00–1.20)
Total Protein: 6.6 g/dL (ref 6.4–8.3)

## 2022-08-26 NOTE — Progress Notes (Signed)
Follow Up Note -- Hematology/ Medical Oncology    Patient Name: Susan Hardy Attending:  Linward Foster, MD   DOB: December 11, 1975  Age:47 y.o. 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   2016: PET showed 2mm lung nodule  TAH-BSO 2010    Above all done in [place], [place]  3.  Pt reports recent CT c/a/p 4mm lung nodule, 11mm abnormality left pelvis  History of Present Illness   Susan Hardy is here today for survivorship care. She was treated for breast cancer in 2011 in [place], [place], BRCA-1 positive. Has had bilateral total mastectomies and TAHBSO.    We last saw her one year ago. Since that time she has seen a gyn oncologist. She is not planning to have any imaging done at this time. She feels well overall with no bone pain, abdominal pain, weight loss or headaches.  Tumor markers were drawn today.       Past Medical History     Past Medical History:   Diagnosis Date    Abdominal pain     Anxiety 06/2006    Cancer (HCC) 10/21/2008    LEFT Breast    Depression 06/2006    GERD (gastroesophageal reflux disease) 07/09/2021    AND BILE REFLUX, LOOSE STOOLS    Hyperlipidemia     Limb alert care status     NO BP OR IV TO LEFT ARM    Lung nodule     Nausea after anesthesia     Neuropathy 01/2009    FEET    Restless legs syndrome 06/2009        Past Surgical History     Past Surgical History:   Procedure Laterality Date    CHOLECYSTECTOMY, LAPAROSCOPIC N/A 09/13/2021    CHOLECYSTECTOMY LAPAROSCOPIC performed by Erle Crocker, MD at [facility] MA[facility] OR    ENDOSCOPY, COLON, DIAGNOSTIC  08/29/2021    HYSTERECTOMY, TOTAL ABDOMINAL (CERVIX REMOVED)  11/2008    AND BSO    MASTECTOMY, BILATERAL  11/2008    OTHER SURGICAL HISTORY  2010     PORT INSERTION AND REMOVAL AFTER TREATMENT COMPLETION    WISDOM TOOTH EXTRACTION  1998        Family History     Family History   Problem Relation Age of Onset    Thyroid Disease Mother     Lupus Mother     Rheum Arthritis Mother     Unknown Father         Social History     Social History     Tobacco Use   Smoking Status Never   Smokeless Tobacco Never      Social History     Substance and Sexual Activity   Alcohol Use Yes    Comment: 1-2 DRINKS/MONTH          Allergies     Allergies   Allergen Reactions    Lavender Oil Hives       Review of Systems   As mentioned in the history of present illness. All remaining 10 point systems were reviewed and remain negative.  Medications     Current Outpatient Medications   Medication Sig Dispense Refill    traZODone (DESYREL) 100 MG tablet TAKE 1.5 TABLETS BY MOUTH NIGHTLY 135 tablet 1    citalopram (CELEXA) 20 MG tablet TAKE 1 TABLET BY MOUTH EVERY DAY 90 tablet 1    pantoprazole (PROTONIX) 40 MG tablet TAKE 1 TABLET BY MOUTH EVERY DAY BEFORE BREAKFAST 90 tablet 1    Multiple Vitamins-Minerals (THERAPEUTIC MULTIVITAMIN-MINERALS) tablet Take 1 tablet by mouth daily      fluticasone (FLONASE) 50 MCG/ACT nasal spray 1 spray by Nasal route every morning      loratadine (CLARITIN) 10 MG tablet Take 1 tablet by mouth at bedtime Take 1 tablet orally once a day       No current facility-administered medications for this visit.       Physical Exam   BP 113/78   Pulse 94   Ht 1.778 m (5\' 10" )   Wt 88.9 kg (196 lb)   BMI 28.12 kg/m      This is a 47 y.o. female patient in no acute distress.   She answers my questions appropriately.  She is awake, alert and oriented.   HEENT: Normal conjunctiva and lids. Oropharynx unremarkable, Neck without cervical or supraclavicular adenopathy or mass.   Pulmonary: Symmetric expansion, no accessory muscle use, no distress  Cardiovascular:  RRR, no peripheral edema  Abdomen : Soft, non-tender, no mass or hepatosplenomegaly, normal bowel  sounds.   Skin: no rash, no petechiae, no ecchymosis, no pallor  Musculoskeletal: normal muscle strength  Neurological: no focal deficit, normal gait, alert & oriented x 3       Labs/Imaging:     Lab Results   Component Value Date/Time    WBC 7.4 08/26/2022 02:42 PM    HGB 14.0 08/26/2022 02:42 PM    HCT 42.7 08/26/2022 02:42 PM    PLT 211 08/26/2022 02:42 PM    MCV 88.0 08/26/2022 02:42 PM       Lab Results   Component Value Date/Time    NA 140 05/28/2022 03:18 PM    K 5.3 05/28/2022 03:18 PM    CL 102 05/28/2022 03:18 PM    CO2 25 05/28/2022 03:18 PM    BUN 13 05/28/2022 03:18 PM    GLOB 2.4 05/28/2022 03:18 PM    ALT 22 05/28/2022 03:18 PM         Visit Diagnoses     1. History of breast cancer          ASSESSMENT AND PLAN   A very  nice 47 yo woman with a history of BRCA-1 + breast cancer, treated in 2011. We will check tumor markers today including a CA 27-29 and CA 125. Her other lab work is stable so far.    Plan:  Labs today CA 125 and CA 27-9   Gyn-onc follow up as scheduled  No imaging given bilateral mastectomy  RTC 1 year    Noralee Stain, PA-C    Orders Placed This Encounter    Basic Metabolic Panel     Standing Status:   Future     Number of Occurrences:   1     Standing Expiration Date:   08/26/2023    CBC with Auto Differential     Standing Status:   Future     Number of Occurrences:   1     Standing Expiration Date:   08/26/2023    Hepatic Function Panel  Standing Status:   Future     Number of Occurrences:   1     Standing Expiration Date:   08/26/2023

## 2022-08-27 LAB — CA 125: CA-125: 8.5 unit/mL (ref 0.0–38.1)

## 2022-08-28 LAB — CANCER ANTIGEN 27.29: CA 27.29: 25.4 unit/mL (ref 0.0–38.6)

## 2022-11-01 MED ORDER — PANTOPRAZOLE SODIUM 40 MG PO TBEC
40 | ORAL_TABLET | Freq: Every day | ORAL | 1 refills | Status: AC
Start: 2022-11-01 — End: ?

## 2022-11-29 ENCOUNTER — Encounter: Attending: Family Medicine | Primary: Family Medicine

## 2023-08-25 ENCOUNTER — Ambulatory Visit: Primary: Family Medicine

## 2023-08-25 ENCOUNTER — Ambulatory Visit: Attending: Hematology & Oncology | Primary: Family Medicine
